# Patient Record
Sex: Male | Born: 1955
Health system: Southern US, Community
[De-identification: ages and names within clinical notes are randomized; demographics above are authoritative.]

## PROBLEM LIST (undated history)

## (undated) DIAGNOSIS — R0600 Dyspnea, unspecified: Secondary | ICD-10-CM

## (undated) DIAGNOSIS — J449 Chronic obstructive pulmonary disease, unspecified: Secondary | ICD-10-CM

## (undated) DIAGNOSIS — K219 Gastro-esophageal reflux disease without esophagitis: Secondary | ICD-10-CM

## (undated) DIAGNOSIS — Z87442 Personal history of urinary calculi: Secondary | ICD-10-CM

## (undated) DIAGNOSIS — J189 Pneumonia, unspecified organism: Secondary | ICD-10-CM

## (undated) DIAGNOSIS — I219 Acute myocardial infarction, unspecified: Secondary | ICD-10-CM

## (undated) DIAGNOSIS — I10 Essential (primary) hypertension: Secondary | ICD-10-CM

## (undated) DIAGNOSIS — I2510 Atherosclerotic heart disease of native coronary artery without angina pectoris: Secondary | ICD-10-CM

## (undated) HISTORY — DX: Atherosclerotic heart disease of native coronary artery without angina pectoris: I25.10

## (undated) HISTORY — PX: OTHER SURGICAL HISTORY: SHX169

## (undated) HISTORY — PX: ANKLE FRACTURE SURGERY: SHX122

## (undated) HISTORY — PX: CAROTID STENT: SHX1301

---

## 2003-04-03 ENCOUNTER — Emergency Department (HOSPITAL_COMMUNITY): Admission: EM | Admit: 2003-04-03 | Discharge: 2003-04-03 | Payer: Self-pay | Admitting: Emergency Medicine

## 2003-04-03 ENCOUNTER — Encounter: Payer: Self-pay | Admitting: Emergency Medicine

## 2003-04-22 ENCOUNTER — Encounter: Payer: Self-pay | Admitting: Family Medicine

## 2003-04-22 ENCOUNTER — Ambulatory Visit (HOSPITAL_COMMUNITY): Admission: RE | Admit: 2003-04-22 | Discharge: 2003-04-22 | Payer: Self-pay | Admitting: Family Medicine

## 2003-04-29 ENCOUNTER — Ambulatory Visit (HOSPITAL_COMMUNITY): Admission: RE | Admit: 2003-04-29 | Discharge: 2003-04-29 | Payer: Self-pay | Admitting: Family Medicine

## 2003-07-08 ENCOUNTER — Ambulatory Visit (HOSPITAL_COMMUNITY): Admission: RE | Admit: 2003-07-08 | Discharge: 2003-07-08 | Payer: Self-pay | Admitting: Family Medicine

## 2009-02-07 ENCOUNTER — Emergency Department (HOSPITAL_COMMUNITY): Admission: EM | Admit: 2009-02-07 | Discharge: 2009-02-07 | Payer: Self-pay | Admitting: Emergency Medicine

## 2009-12-03 ENCOUNTER — Emergency Department (HOSPITAL_COMMUNITY): Admission: EM | Admit: 2009-12-03 | Discharge: 2009-12-03 | Payer: Self-pay | Admitting: Emergency Medicine

## 2010-07-01 DIAGNOSIS — I2510 Atherosclerotic heart disease of native coronary artery without angina pectoris: Secondary | ICD-10-CM

## 2010-07-01 HISTORY — DX: Atherosclerotic heart disease of native coronary artery without angina pectoris: I25.10

## 2010-07-01 HISTORY — PX: CORONARY ANGIOPLASTY: SHX604

## 2010-07-12 ENCOUNTER — Emergency Department (HOSPITAL_COMMUNITY)
Admission: EM | Admit: 2010-07-12 | Discharge: 2010-07-12 | Disposition: A | Discharge status: Other Acute Inpatient Hospital | Payer: Self-pay | Source: Home / Self Care | Admitting: Emergency Medicine

## 2010-07-12 ENCOUNTER — Encounter: Payer: Self-pay | Admitting: Internal Medicine

## 2010-07-12 ENCOUNTER — Inpatient Hospital Stay (HOSPITAL_COMMUNITY)
Admission: EM | Admit: 2010-07-12 | Discharge: 2010-07-14 | Payer: Self-pay | Source: Home / Self Care | Attending: Internal Medicine | Admitting: Internal Medicine

## 2010-07-16 LAB — TROPONIN I: Troponin I: 1.31 ng/mL (ref 0.00–0.06)

## 2010-07-16 LAB — CBC
HCT: 39.9 % (ref 39.0–52.0)
HCT: 36 % — ABNORMAL LOW (ref 39.0–52.0)
HCT: 35.9 % — ABNORMAL LOW (ref 39.0–52.0)
Hemoglobin: 14.2 g/dL (ref 13.0–17.0)
Hemoglobin: 12.2 g/dL — ABNORMAL LOW (ref 13.0–17.0)
Hemoglobin: 12 g/dL — ABNORMAL LOW (ref 13.0–17.0)
MCH: 31.3 pg (ref 26.0–34.0)
MCH: 30.6 pg (ref 26.0–34.0)
MCH: 30.3 pg (ref 26.0–34.0)
MCHC: 35.6 g/dL (ref 30.0–36.0)
MCHC: 33.9 g/dL (ref 30.0–36.0)
MCHC: 33.4 g/dL (ref 30.0–36.0)
MCV: 88.1 fL (ref 78.0–100.0)
MCV: 90.2 fL (ref 78.0–100.0)
MCV: 90.7 fL (ref 78.0–100.0)
Platelets: 172 10*3/uL (ref 150–400)
Platelets: 150 10*3/uL (ref 150–400)
Platelets: 150 10*3/uL (ref 150–400)
RBC: 4.53 MIL/uL (ref 4.22–5.81)
RBC: 3.99 MIL/uL — ABNORMAL LOW (ref 4.22–5.81)
RBC: 3.96 MIL/uL — ABNORMAL LOW (ref 4.22–5.81)
RDW: 13.3 % (ref 11.5–15.5)
RDW: 13.5 % (ref 11.5–15.5)
RDW: 13.8 % (ref 11.5–15.5)
WBC: 12.6 10*3/uL — ABNORMAL HIGH (ref 4.0–10.5)
WBC: 10.2 10*3/uL (ref 4.0–10.5)
WBC: 8.4 10*3/uL (ref 4.0–10.5)

## 2010-07-16 LAB — LIPID PANEL
Cholesterol: 165 mg/dL (ref 0–200)
HDL: 36 mg/dL — ABNORMAL LOW (ref >39)
LDL Cholesterol: 116 mg/dL — ABNORMAL HIGH (ref 0–99)
Total CHOL/HDL Ratio: 4.6 RATIO
Triglycerides: 66 mg/dL (ref <150)
VLDL: 13 mg/dL (ref 0–40)

## 2010-07-16 LAB — DIFFERENTIAL
Basophils Absolute: 0 10*3/uL (ref 0.0–0.1)
Basophils Relative: 0 % (ref 0–1)
Eosinophils Absolute: 0 10*3/uL (ref 0.0–0.7)
Eosinophils Relative: 0 % (ref 0–5)
Lymphocytes Relative: 15 % (ref 12–46)
Lymphs Abs: 1.9 10*3/uL (ref 0.7–4.0)
Monocytes Absolute: 0.7 10*3/uL (ref 0.1–1.0)
Monocytes Relative: 6 % (ref 3–12)
Neutro Abs: 9.9 10*3/uL — ABNORMAL HIGH (ref 1.7–7.7)
Neutrophils Relative %: 79 % — ABNORMAL HIGH (ref 43–77)

## 2010-07-16 LAB — COMPREHENSIVE METABOLIC PANEL
ALT: 20 U/L (ref 0–53)
ALT: 22 U/L (ref 0–53)
AST: 25 U/L (ref 0–37)
AST: 72 U/L — ABNORMAL HIGH (ref 0–37)
Albumin: 4.3 g/dL (ref 3.5–5.2)
Albumin: 3.3 g/dL — ABNORMAL LOW (ref 3.5–5.2)
Alkaline Phosphatase: 61 U/L (ref 39–117)
Alkaline Phosphatase: 54 U/L (ref 39–117)
BUN: 16 mg/dL (ref 6–23)
BUN: 16 mg/dL (ref 6–23)
CO2: 26 mEq/L (ref 19–32)
CO2: 22 mEq/L (ref 19–32)
Calcium: 9.4 mg/dL (ref 8.4–10.5)
Calcium: 8.3 mg/dL — ABNORMAL LOW (ref 8.4–10.5)
Chloride: 101 mEq/L (ref 96–112)
Chloride: 106 mEq/L (ref 96–112)
Creatinine, Ser: 1.07 mg/dL (ref 0.4–1.5)
Creatinine, Ser: 0.88 mg/dL (ref 0.4–1.5)
GFR calc Af Amer: >60 mL/min (ref >60)
GFR calc Af Amer: >60 mL/min (ref >60)
GFR calc non Af Amer: >60 mL/min (ref >60)
GFR calc non Af Amer: >60 mL/min (ref >60)
Glucose, Bld: 115 mg/dL — ABNORMAL HIGH (ref 70–99)
Glucose, Bld: 112 mg/dL — ABNORMAL HIGH (ref 70–99)
Potassium: 4.1 mEq/L (ref 3.5–5.1)
Potassium: 4.6 mEq/L (ref 3.5–5.1)
Sodium: 135 mEq/L (ref 135–145)
Sodium: 137 mEq/L (ref 135–145)
Total Bilirubin: 0.3 mg/dL (ref 0.3–1.2)
Total Bilirubin: 0.6 mg/dL (ref 0.3–1.2)
Total Protein: 7.7 g/dL (ref 6.0–8.3)
Total Protein: 5.8 g/dL — ABNORMAL LOW (ref 6.0–8.3)

## 2010-07-16 LAB — POCT CARDIAC MARKERS
CKMB, poc: 11.9 ng/mL (ref 1.0–8.0)
Myoglobin, poc: >500 ng/mL (ref 12–200)
Troponin i, poc: 0.2 ng/mL — ABNORMAL HIGH (ref 0.00–0.09)

## 2010-07-16 LAB — BASIC METABOLIC PANEL
BUN: 14 mg/dL (ref 6–23)
CO2: 24 mEq/L (ref 19–32)
Calcium: 8.7 mg/dL (ref 8.4–10.5)
Chloride: 110 mEq/L (ref 96–112)
Creatinine, Ser: 0.92 mg/dL (ref 0.4–1.5)
GFR calc Af Amer: >60 mL/min (ref >60)
GFR calc non Af Amer: >60 mL/min (ref >60)
Glucose, Bld: 98 mg/dL (ref 70–99)
Potassium: 4.2 mEq/L (ref 3.5–5.1)
Sodium: 140 mEq/L (ref 135–145)

## 2010-07-16 LAB — APTT: aPTT: 28 seconds (ref 24–37)

## 2010-07-16 LAB — PROTIME-INR
INR: 0.93 (ref 0.00–1.49)
Prothrombin Time: 12.7 seconds (ref 11.6–15.2)

## 2010-07-16 LAB — CARDIAC PANEL(CRET KIN+CKTOT+MB+TROPI)
CK, MB: 151 ng/mL (ref 0.3–4.0)
CK, MB: 215.6 ng/mL (ref 0.3–4.0)
Relative Index: 16.2 — ABNORMAL HIGH (ref 0.0–2.5)
Relative Index: 14.1 — ABNORMAL HIGH (ref 0.0–2.5)
Total CK: 930 U/L — ABNORMAL HIGH (ref 7–232)
Total CK: 1526 U/L — ABNORMAL HIGH (ref 7–232)
Troponin I: 8.57 ng/mL (ref 0.00–0.06)
Troponin I: 19.81 ng/mL (ref 0.00–0.06)

## 2010-07-16 LAB — CK TOTAL AND CKMB (NOT AT ARMC)
CK, MB: 33.6 ng/mL (ref 0.3–4.0)
Relative Index: 11.1 — ABNORMAL HIGH (ref 0.0–2.5)
Total CK: 303 U/L — ABNORMAL HIGH (ref 7–232)

## 2010-07-16 LAB — HEPARIN LEVEL (UNFRACTIONATED)
Heparin Unfractionated: 0.11 IU/mL — ABNORMAL LOW (ref 0.30–0.70)
Heparin Unfractionated: <0.1 IU/mL — ABNORMAL LOW (ref 0.30–0.70)

## 2010-07-16 LAB — MAGNESIUM: Magnesium: 2.1 mg/dL (ref 1.5–2.5)

## 2010-07-16 LAB — MRSA PCR SCREENING: MRSA by PCR: NEGATIVE

## 2010-07-18 ENCOUNTER — Telehealth: Payer: Self-pay | Admitting: Internal Medicine

## 2010-07-22 NOTE — H&P (Addendum)
NAME:  Juan Martinez, Juan Martinez NO.:  [id number]  MEDICAL RECORD NO.:  [id number]          PATIENT TYPE:  INP  LOCATION:  2902                         FACILITY:  [facility]  PHYSICIAN:  Bevelyn Buckles. Nelsy Madonna, MDDATE OF BIRTH:  16-Nov-1955  DATE OF ADMISSION:  07/12/2010 DATE OF DISCHARGE:                             HISTORY & PHYSICAL   CHIEF COMPLAINT:  Chest pain.  HISTORY OF PRESENT ILLNESS:  The patient is a 55 year old white male with past medical history significant for tobacco use presenting with a several-hour history of burning in the chest.  The patient states that earlier this afternoon, he had acute onset of burning sensation in his chest that radiated to his left arm.  He denied any associated shortness of breath, nausea, vomiting, or diaphoresis.  The pain lasted approximately 1 hour before resolving then reoccurred.  At this point, he sought medical attention at the emergency room.  In emergency room, his troponin was found to be 1.5 with an unremarkable EKG.  He was given aspirin, Plavix, heparin, nitroglycerin, and metoprolol with resolution of the chest discomfort.  Here at [facility], the patient remains chest pain free.  He states that other than the chest discomfort today he has been in his normal state of health.  He has a primary care physician who he sees irregularly and denies any history of medical problems or current medication use.  PAST MEDICAL HISTORY:  None.  SOCIAL HISTORY:  Occasional alcohol, 2 packs a day of tobacco.  FAMILY HISTORY:  Positive for coronary artery disease in the mother; however, the age is unclear.  ALLERGIES:  No known drug allergies.  MEDICATIONS:  None.  REVIEW OF SYSTEMS:  As in HPI.  All other systems were reviewed and are negative.  PHYSICAL EXAMINATION:  VITAL SIGNS:  Blood pressure 197/55, heart rate 56, saturating 97% on 2 L. GENERAL:  No acute distress. HEENT:  Normocephalic, atraumatic. NECK:  Supple.   There is no JVD. HEART:  Regular rate and rhythm without murmur, rub, or gallop. LUNGS:  Clear to auscultation bilaterally. ABDOMEN:  Soft, nontender, nondistended. EXTREMITIES:  Without edema. SKIN:  Warm and dry. PSYCHIATRIC:  The patient is appropriate. NEURO:  Nonfocal. MUSCULOSKELETAL:  5/5 bilateral upper and lower extremity strength.  LABS FROM THE OUTSIDE HOSPITAL:  Sodium 135, potassium 4.1, chloride 101, CO2 of 26, BUN 16, creatinine 1.07, glucose 115.  White count 12.6, hemoglobin 14.2, hematocrit 39.9, platelet count 172.  Total CK 303, CK- MB 33.6, troponin was 1.3, INR 0.93.  EKG independently interpreted by myself demonstrates normal sinus rhythm with nonspecific ST-segment abnormalities in the inferior leads.  ASSESSMENT: 1. Acute coronary artery syndrome/non-ST-elevation myocardial     infarction. 2. Ongoing tobacco use.  PLAN:  The patient is admitted to the CCU under Dr. Gala Romney.  The aspirin, heparin, low-dose beta-blocker initiated at the outside hospital will be continued.  We will also place him on a statin.  He was loaded with 300 mg of Plavix at the outside hospital; however, I will hold off on this for now.  If he does go to intervention, we would recommend  additional 300 mg load or change to Effient.  He will be made n.p.o. after midnight for left heart catheterization in the a.m.  There appears to be no obvious contraindication to drug-eluting stent if PCI is warranted.     Brayton El, MD   ______________________________ Bevelyn Buckles. Trica Usery, MD    SGA/MEDQ  D:  07/13/2010  T:  07/13/2010  Job:  [id number]  Electronically Signed by Raynelle Bring MD on 07/13/2010 02:27:46 PM Electronically Signed by Arvilla Meres MD on 07/22/2010 07:13:16 PM

## 2010-07-23 ENCOUNTER — Ambulatory Visit: Admit: 2010-07-23 | Payer: Self-pay

## 2010-07-23 NOTE — Discharge Summary (Addendum)
NAME:  Juan Martinez, Juan Martinez                ACCOUNT NO.:  [id number]  MEDICAL RECORD NO.:  [id number]          PATIENT TYPE:  INP  LOCATION:  6531                         FACILITY:  [facility]  PHYSICIAN:  Colleen Can. Deborah Chalk, M.D.DATE OF BIRTH:  30-Sep-1955  DATE OF ADMISSION:  07/12/2010 DATE OF DISCHARGE:  07/14/2010                              DISCHARGE SUMMARY   DISCHARGE DIAGNOSES: 1. Non-ST segment elevation myocardial infarction with a peak troponin     of 19.81. 2. Newly diagnosed coronary artery disease by cath on July 13, 2010     with totally occluded right coronary artery, status post     percutaneous coronary intervention and Vision bare-metal stent     placement.     a.     Residual disease with 30% ostial left main, 60-70% stenosis      in the midportion of the circumflex, 30% ostial first diagonal,      30% proximal left anterior descending coronary artery. 3. Left ventricular ejection fraction of 45-50% with 2-3+ mitral     regurgitation by left ventriculogram by cath, July 13, 2010, for     followup echocardiogram as an outpatient. 4. Hyperlipidemia, total cholesterol 165, triglycerides 66, HDL 36,     LDL 116, initiated on statin therapy. 5. Tobacco abuse.  HOSPITAL COURSE:  Juan Martinez is a 55 year old gentleman with no prior cardiac history, who presented to [facility] with complaints of chest pain.  He does have a history of tobacco abuse.  His enzymes resulted being positive, and then he was transferred to [facility].  Peak troponin was 19.81.  He underwent cardiac catheterization on July 13, 2010 initially as a diagnostic by Dr. Shirlee Latch, which found the residual disease above, but also a totally occluded RCA and the proximal vessel after small acute marginals.  EF was 45-50% with 2-3+ mitral regurgitation.  The RCA was stented with a 3.5 x 18-mm Vision bare-metal stent.  The patient tolerated the procedure well.  He ambulated with cardiac  rehab and was thoroughly consulted regarding his tobacco abuse.  He was initiated on coronary artery disease regimen of medications including aspirin, beta-blocker, Plavix, and statin.  Dr. Deborah Chalk seen and examined him today and feels he is stable for discharge.  DISCHARGE LABS:  WBC 8.4, hemoglobin 12, hematocrit 35.9, platelet count 150.  Sodium 140, potassium 4.2, chloride 110, CO2 of 24, glucose 98, BUN 14, creatinine 0.92.  Total cholesterol 165, triglycerides 66, HDL 36, LDL 116.  STUDIES: 1. Cardiac catheterization on July 13, 2010, please see full report     for details as well as HPI for summary. 2. Chest x-ray on July 12, 2010 showed mild bronchitic changes and     borderline cardiomegaly.  DISCHARGE MEDICATIONS: 1. Nitroglycerin 0.4 mg every 5 minutes as needed, sublingual up to 3     doses for chest pain. 2. Aspirin 81 mg daily. 3. Toprol-XL 25 mg daily. 4. Plavix 75 mg daily. 5. Lipitor 20 mg at bedtime.  DISPOSITION:  Juan Martinez will be discharged in stable condition to home. He is not to return to work until  cleared by his cardiologist.  He is not to lift anything or participate in sexual activity for 1 week or drive for 2 days.  He is to follow a low-sodium, heart-healthy diet and if he notices any pain, swelling, bleeding, or pus at the cath site, he is to call or return.  He will follow up with Dr. Gala Romney in approximately 2 weeks and our office will call him with this appointment.  Our office will also call to arrange a 2-D echocardiogram to evaluate for his mitral regurgitation given findings on catheterization.  DURATION OF DISCHARGE ENCOUNTER:  Greater than 30 minutes including physician and PA time.     Dayna Dunn, P.A.C.   ______________________________ Colleen Can. Deborah Chalk, M.D.    DD/MEDQ  D:  07/14/2010  T:  07/15/2010  Job:  [id number]  cc:   Bevelyn Buckles. Bensimhon, MD  Electronically Signed by Ronie Spies  on 07/23/2010 10:32:23  AM Electronically Signed by Roger Shelter M.D. on 07/26/2010 03:35:00 PM

## 2010-07-31 NOTE — Procedures (Signed)
  NAME:  Juan Martinez, Juan Martinez                ACCOUNT NO.:  [id number]  MEDICAL RECORD NO.:  [id number]          PATIENT TYPE:  INP  LOCATION:  6531                         FACILITY:  [facility]  PHYSICIAN:  Veverly Fells. Excell Seltzer, MD  DATE OF BIRTH:  01-16-1956  DATE OF PROCEDURE:  07/13/2010 DATE OF DISCHARGE:                           CARDIAC CATHETERIZATION   PROCEDURE:  PTCA and stenting of the right coronary artery.  PROCEDURAL INDICATION:  [name] is a 55 year old gentleman who presented with non-ST-elevation infarction.  He had prolonged pain yesterday.  His had elevated enzymes.  CKs went up to 930, MBs to 150, and troponin went from 1 up to 8.  He underwent diagnostic catheterization by Dr. Shirlee Latch today that demonstrated total occlusion of the right coronary artery with faint collaterals from the left coronary.  We elected to proceed with PCI.  There was an indwelling 5-French sheath in the right radial artery.  The sheath was upsized to a 6-French over a 0.035 inch wire.  The JR-4 guide catheter was inserted and the patient was treated with bivalirudin for anticoagulation.  He has been adequately preloaded with Plavix.  A Cougar guidewire was advanced easily beyond the occlusion into the distal vessel.  The vessel was predilated with a 2.5 x 15-mm balloon.  The vessel was then stented with a 3.5 x 18-mm Vision bare metal stent.  The stent was deployed at 14 atmospheres and was well expanded.  There is an excellent angiographic result with 0% residual stenosis.  The stent was postdilated with a 3.75 x 12-mm White Bird Trek balloon which was taken to 16 atmospheres.  After postdilatation, the PDA was occluded and I suspect this was secondary to distal embolization.  The Cougar wire was advanced across the occlusion and a 2.0 x 15-mm Apex balloon was inflated to 6 and then 4 atmospheres more distally.  This reopened the vessel and reestablished TIMI 3 flow. The occlusion was moved to the most  distal portion of the PDA which was beyond an area that could be intervened upon.  Overall, there was an excellent angiographic result with 0% residual stenosis and TIMI 3 flow. The patient tolerated the procedure well.  There were no immediate complications.  FINAL CONCLUSIONS:  Successful PCI of the mid-right coronary artery using a single bare metal stent.  RECOMMENDATIONS:  The patient should remain on dual antiplatelet therapy with aspirin and Plavix for a minimum of 30 days and preferably for 1 year in the setting of his myocardial infarction.     Veverly Fells. Excell Seltzer, MD     MDC/MEDQ  D:  07/13/2010  T:  07/14/2010  Job:  [id number]  cc:   Kirk Ruths, M.D.  Electronically Signed by Tonny Bollman MD on 07/31/2010 04:49:59 AM

## 2010-08-02 NOTE — Progress Notes (Signed)
Summary: pt having shoulder pain and elevated b/p  Phone Note Call from Patient Call back at Home Phone [phone]   Caller: Spouse Reason for Call: Talk to Nurse, Talk to Doctor Summary of Call: pt b/p is 141/89 and he is haveing pain in right shoulder and he feels bad no other symptoms Initial call taken by: Omer Jack,  July 18, 2010 1:24 PM  Follow-up for Phone Call        Called patient's girlfriend back-not sure what to be concerned about since being d/c'd from hosp. Sat.  Having some right shoulder pain and BP 141/ 89.  Feeling OK otherwise.  On ASA and Pradaxa.  Advised try NTG sl for up to 3 and then if pain not relieved to go to ED.  Voiced understanding.  Follow-up by: Dessie Coma  LPN,  July 18, 2010 1:40 PM

## 2010-08-06 ENCOUNTER — Ambulatory Visit (HOSPITAL_COMMUNITY): Payer: BC Managed Care – PPO | Attending: Internal Medicine

## 2010-08-06 ENCOUNTER — Encounter: Payer: Self-pay | Admitting: Internal Medicine

## 2010-08-06 ENCOUNTER — Ambulatory Visit (INDEPENDENT_AMBULATORY_CARE_PROVIDER_SITE_OTHER): Payer: BC Managed Care – PPO | Admitting: Internal Medicine

## 2010-08-06 DIAGNOSIS — I219 Acute myocardial infarction, unspecified: Secondary | ICD-10-CM | POA: Insufficient documentation

## 2010-08-06 DIAGNOSIS — I059 Rheumatic mitral valve disease, unspecified: Secondary | ICD-10-CM

## 2010-08-06 DIAGNOSIS — I10 Essential (primary) hypertension: Secondary | ICD-10-CM | POA: Insufficient documentation

## 2010-08-06 DIAGNOSIS — I079 Rheumatic tricuspid valve disease, unspecified: Secondary | ICD-10-CM | POA: Insufficient documentation

## 2010-08-06 DIAGNOSIS — E785 Hyperlipidemia, unspecified: Secondary | ICD-10-CM | POA: Insufficient documentation

## 2010-08-06 DIAGNOSIS — I2510 Atherosclerotic heart disease of native coronary artery without angina pectoris: Secondary | ICD-10-CM | POA: Insufficient documentation

## 2010-08-09 NOTE — Procedures (Signed)
  NAME:  Juan Martinez, SALO NO.:  [id number]  MEDICAL RECORD NO.:  [id number]          PATIENT TYPE:  INP  LOCATION:  6531                         FACILITY:  [facility]  PHYSICIAN:  Marca Ancona, MD      DATE OF BIRTH:  [birth year]  DATE OF PROCEDURE:  07/13/2010 DATE OF DISCHARGE:                           CARDIAC CATHETERIZATION   PROCEDURE: 1. Left heart catheterization. 2. Coronary angiography. 3. Left ventriculography.  INDICATIONS:  This is a 55 year old who presented to [facility] with acute coronary syndrome.  He was set up for a left heart catheterization today.  PROCEDURE NOTE:  After informed consent was obtained, the patient underwent Allen testing on his right wrist.  The Allen test was positive signifying good collateral circulation from the ulnar artery to radial side of the hand.  The right wrist was sterilely prepped and draped. Lidocaine 1% was used to locally anesthetize the right radial area.  The right radial artery was accessed using modified Seldinger technique and a 5-French arterial sheath was placed.  The patient then received 3 mg of intra-arterial verapamil and 4000 units of IV heparin.  The right coronary artery was engaged using the JR-4 catheter.  The left coronary artery was engaged using JL-3.5 catheter and left ventricle was entered using angled pigtail catheter.  There were no complications.  FINDINGS: 1. Hemodynamics:  LV 96/20, aorta 101/64. 2. Left ventriculography:  EF was estimated at 45-50%.  The basal to     mid inferior wall was akinetic.  There was 2 to 3+ mitral     regurgitation. 3. Right coronary artery:  The right coronary artery was totally     occluded in the proximal vessel after 2 small acute marginals.  The     PDA reconstituted via faint left-to-right collaterals. 4. Left main:  The left main had about 30% ostial stenosis. 5. Left circumflex:  The circumflex gave off a moderate-sized obtuse     marginal and  following that, there was 60-70% stenosis in the     midportion of the vessel. 6. LAD system:  There was a high first diagonal about 30% ostial     stenosis.  There was 30% proximal stenosis in the LAD at about 30%     midvessel stenosis.  IMPRESSION:  This is a 55 year old who presented with acute coronary syndrome.  He is found to have an occluded right coronary artery and with a corresponding wall motion abnormality on left ventriculography.  We will plan percutaneous coronary intervention today.  Additionally, the patient has 2 to 3+ mitral regurgitation on his LV gram.  He should get an echocardiogram done at some point.     Marca Ancona, MD     DM/MEDQ  D:  07/13/2010  T:  07/14/2010  Job:  [id number]  Electronically Signed by Marca Ancona MD on 08/09/2010 08:31:25 AM

## 2010-08-16 NOTE — Letter (Signed)
Summary: [facility] Discharge Summary  [facility] Discharge Summary   Imported By: Earl Many 08/08/2010 16:47:10  _____________________________________________________________________  External Attachment:    Type:   Image     Comment:   External Document

## 2010-08-16 NOTE — Assessment & Plan Note (Signed)
Summary: Juan Martinez appt made from weekend mess-mb/hms   Visit Type:  Follow-up  CC:  no cardiac complaints.  History of Present Illness: Juan Martinez is a 55 year old male with a h/o HTN and heavy tobacco use. He was admitted in January 2012 with NSTEMI.  Peak troponin was 19.81.  He underwent cardiac catheterization on July 13, 2010 initially as a diagnostic by Dr. Shirlee Latch which showed totally occluded RCA and mild to moderate non-osbstructive CAD on the left.  EF was 45-50% with 2-3+ mitral regurgitation.  The RCA was stented with a 3.5 x 18-mm Vision bare-metal stent.   Returns to day for routine f/i. Back to work at [employer]. Denies CP. Occasional DOE. + GERD. Feels like medication is giving him headaches. has been compliant with Plavix. Continues to smoke 1/2 PPD (down from 3 PPD).  He had echo today in office which I reviewed personally. EF 50% with akinesis of basilar to mid inferior wal. Mild MR.   Current Medications (verified): 1)  Aspirin 81 Mg Tbec (Aspirin) .... Take One Daily 2)  Plavix 75 Mg Tabs (Clopidogrel Bisulfate) .... Take One Daily 3)  Nitrostat 0.4 Mg Subl (Nitroglycerin) .... Take As Needed 4)  Atorvastatin Calcium 20 Mg Tabs (Atorvastatin Calcium) .... Take One Daily 5)  Metoprolol Succinate 25 Mg Xr24h-Tab (Metoprolol Succinate) .... Take One Tablet By Mouth Daily  Allergies (verified): No Known Drug Allergies  Past History:  Past Medical History:  Non-ST segment elevation myocardial infarction-2012  Newly diagnosed coronary artery disease by cath on July 13, 2010       with totally occluded right coronary artery, status post       percutaneous coronary intervention and Vision bare-metal stent       placement.  Hyperlipidemia HTN COPD with ongoing obacco abuse  Family History: Reviewed history from 08/03/2010 and no changes required.  Positive for coronary artery disease in the mother;   however, the age is unclear.   Social History: Reviewed  history from 08/03/2010 and no changes required. Married. Works at [employer].  Occasional alcohol.  2-3 packs a day of tobacco  Review of Systems       As per HPI and past medical history; otherwise all systems negative.   Vital Signs:  Patient profile:   55 year old male Height:      69 inches Weight:      208 pounds BMI:     30.83 Pulse rate:   76 / minute Pulse rhythm:   regular BP sitting:   142 / 92  (right arm)  Vitals Entered By: Jacquelin Hawking, CMA (August 06, 2010 3:50 PM)  Physical Exam  General:  No acute distress. no resp difficulty HEENT: normal except for poor dentition Neck: supple. no JVD. Carotids 2+ bilat; no bruits. No lymphadenopathy or thryomegaly appreciated. Cor: PMI nondisplaced. Distant heart sounds.  Regular rate & rhythm. No rubs, gallops, murmur. Lungs: clearwith decreased air mvmt throughout Abdomen: obese soft, nontender, nondistended. No hepatosplenomegaly. No bruits or masses. Good bowel sounds. Extremities: no cyanosis, clubbing, rash, edema Neuro: alert & orientedx3, cranial nerves grossly intact. moves all 4 extremities w/o difficulty. affect pleasant    Impression & Recommendations:  Problem # 1:  CAD (ICD-414.00) Doing well post PCI. Not interested in cardiac rehab due to work schedule.  Will need aggressive RF management.   Problem # 2:  TOBACCO USE Counseled on need to quit smoking. Will scan abdominal aorta to rule out AAA.   Problem #  3:  HTN BP elevated. Will change lopressor to carvedilol 6.25 two times a day. Can titrate as needed.   Problem # 4:  MITRAL REGURGITATION Likele due to papillary muscle dysfunction due to inferior MI. This is mild. No further work-up for this currently.   Other Orders: EKG w/ Interpretation (93000)  Patient Instructions: 1)  Stop Metoprolol 2)  Start Carvedilol 6.25mg  two times a day  3)  Your physician has requested that you have an abdominal aorta duplex. During this test, an  ultrasound is used to evaluate the aorta. Allow 30 minutes for this exam. Do not eat after midnight the day before and avoid carbonated beverages. There are no restrictions or special instructions.  Needs in 3 months. 4)  Your physician wants you to follow-up in: 3 months.  You will receive a reminder letter in the mail two months in advance. If you don't receive a letter, please call our office to schedule the follow-up appointment. Prescriptions: CARVEDILOL 6.25 MG TABS (CARVEDILOL) Take one tablet by mouth twice a day  #30 x 6   Entered by:   Meredith Staggers, RN   Authorized by:   Dolores Patty, MD, [facility]   Signed by:   Meredith Staggers, RN on 08/06/2010   Method used:   Electronically to        [facility] [phone]* (retail)       [address]       [place], [place]  [address]       Ph: [phone]       Fax: [phone]   RxID:   [id number]

## 2010-11-15 ENCOUNTER — Other Ambulatory Visit: Payer: Self-pay | Admitting: *Deleted

## 2010-11-15 MED ORDER — CARVEDILOL 6.25 MG PO TABS: 6.2500 mg | ORAL_TABLET | Freq: Two times a day (BID) | ORAL | Status: DC

## 2010-11-22 ENCOUNTER — Telehealth: Payer: Self-pay | Admitting: Internal Medicine

## 2010-11-22 NOTE — Telephone Encounter (Signed)
6.25 mg carvedilol needs refill -uses [facility] [place] [phone]

## 2010-11-23 ENCOUNTER — Telehealth: Payer: Self-pay | Admitting: Internal Medicine

## 2010-11-23 MED ORDER — CARVEDILOL 6.25 MG PO TABS: 6.2500 mg | ORAL_TABLET | Freq: Two times a day (BID) | ORAL | Status: DC

## 2010-11-23 NOTE — Telephone Encounter (Signed)
Pt need Carvedilol to be call in to [facility] # [phone]

## 2010-11-23 NOTE — Telephone Encounter (Signed)
Pharmacy calling to get refill of carvedilol at [facility]

## 2010-11-23 NOTE — Telephone Encounter (Signed)
RX sent into pharmacy. Pt notified. 

## 2010-11-23 NOTE — Telephone Encounter (Signed)
[facility]vediolol [facility] [place]

## 2010-11-24 ENCOUNTER — Other Ambulatory Visit: Payer: Self-pay | Admitting: *Deleted

## 2010-11-24 MED ORDER — CARVEDILOL 6.25 MG PO TABS: 6.2500 mg | ORAL_TABLET | Freq: Two times a day (BID) | ORAL | Status: DC

## 2010-11-27 ENCOUNTER — Other Ambulatory Visit: Payer: Self-pay | Admitting: *Deleted

## 2010-11-27 DIAGNOSIS — I10 Essential (primary) hypertension: Secondary | ICD-10-CM

## 2010-11-27 MED ORDER — CARVEDILOL 6.25 MG PO TABS: 6.2500 mg | ORAL_TABLET | Freq: Two times a day (BID) | ORAL | Status: DC

## 2010-12-14 ENCOUNTER — Telehealth: Payer: Self-pay | Admitting: Internal Medicine

## 2010-12-14 NOTE — Telephone Encounter (Signed)
Steward Drone from dr dowdy office has question re pt meds and stent for dental procedure.

## 2010-12-14 NOTE — Telephone Encounter (Signed)
Needs tooth extraction wanted to know about holding Plavix advised we normally do not hold Plavix she will let MD know

## 2011-01-11 ENCOUNTER — Other Ambulatory Visit: Payer: Self-pay | Admitting: Internal Medicine

## 2011-03-02 DIAGNOSIS — I2510 Atherosclerotic heart disease of native coronary artery without angina pectoris: Secondary | ICD-10-CM

## 2011-03-02 HISTORY — DX: Atherosclerotic heart disease of native coronary artery without angina pectoris: I25.10

## 2011-03-21 ENCOUNTER — Inpatient Hospital Stay (HOSPITAL_COMMUNITY)
Admission: EM | Admit: 2011-03-21 | Discharge: 2011-03-24 | DRG: 122 | Disposition: A | Discharge status: Home / Self Care | Payer: BC Managed Care – PPO | Source: Other Acute Inpatient Hospital | Attending: Cardiology | Admitting: Cardiology

## 2011-03-21 ENCOUNTER — Other Ambulatory Visit: Payer: Self-pay

## 2011-03-21 ENCOUNTER — Encounter: Payer: Self-pay | Admitting: *Deleted

## 2011-03-21 ENCOUNTER — Emergency Department (HOSPITAL_COMMUNITY)
Admission: EM | Admit: 2011-03-21 | Discharge: 2011-03-21 | Disposition: A | Discharge status: Other Acute Inpatient Hospital | Payer: BC Managed Care – PPO | Source: Home / Self Care | Attending: Emergency Medicine | Admitting: Emergency Medicine

## 2011-03-21 DIAGNOSIS — I219 Acute myocardial infarction, unspecified: Secondary | ICD-10-CM | POA: Insufficient documentation

## 2011-03-21 DIAGNOSIS — I2510 Atherosclerotic heart disease of native coronary artery without angina pectoris: Secondary | ICD-10-CM | POA: Diagnosis present

## 2011-03-21 DIAGNOSIS — F17200 Nicotine dependence, unspecified, uncomplicated: Secondary | ICD-10-CM | POA: Diagnosis present

## 2011-03-21 DIAGNOSIS — R0789 Other chest pain: Secondary | ICD-10-CM | POA: Insufficient documentation

## 2011-03-21 DIAGNOSIS — J449 Chronic obstructive pulmonary disease, unspecified: Secondary | ICD-10-CM | POA: Diagnosis present

## 2011-03-21 DIAGNOSIS — I213 ST elevation (STEMI) myocardial infarction of unspecified site: Secondary | ICD-10-CM

## 2011-03-21 DIAGNOSIS — D696 Thrombocytopenia, unspecified: Secondary | ICD-10-CM | POA: Diagnosis present

## 2011-03-21 DIAGNOSIS — I2119 ST elevation (STEMI) myocardial infarction involving other coronary artery of inferior wall: Principal | ICD-10-CM | POA: Diagnosis present

## 2011-03-21 DIAGNOSIS — J4489 Other specified chronic obstructive pulmonary disease: Secondary | ICD-10-CM | POA: Diagnosis present

## 2011-03-21 DIAGNOSIS — E785 Hyperlipidemia, unspecified: Secondary | ICD-10-CM | POA: Diagnosis present

## 2011-03-21 DIAGNOSIS — Z7982 Long term (current) use of aspirin: Secondary | ICD-10-CM

## 2011-03-21 DIAGNOSIS — R079 Chest pain, unspecified: Secondary | ICD-10-CM

## 2011-03-21 DIAGNOSIS — Z7902 Long term (current) use of antithrombotics/antiplatelets: Secondary | ICD-10-CM

## 2011-03-21 DIAGNOSIS — I10 Essential (primary) hypertension: Secondary | ICD-10-CM | POA: Diagnosis present

## 2011-03-21 HISTORY — DX: Acute myocardial infarction, unspecified: I21.9

## 2011-03-21 HISTORY — PX: CARDIAC CATHETERIZATION: SHX172

## 2011-03-21 LAB — CBC
HCT: 43.7 % (ref 39.0–52.0)
HCT: 39.1 % (ref 39.0–52.0)
Hemoglobin: 15.1 g/dL (ref 13.0–17.0)
MCH: 31 pg (ref 26.0–34.0)
MCHC: 34.6 g/dL (ref 30.0–36.0)
MCHC: 35.5 g/dL (ref 30.0–36.0)
MCV: 89.7 fL (ref 78.0–100.0)
MCV: 87.1 fL (ref 78.0–100.0)
Platelets: 159 10*3/uL (ref 150–400)
Platelets: 133 10*3/uL — ABNORMAL LOW (ref 150–400)
RBC: 4.87 MIL/uL (ref 4.22–5.81)
RDW: 13.5 % (ref 11.5–15.5)
RDW: 13.3 % (ref 11.5–15.5)
WBC: 8.8 10*3/uL (ref 4.0–10.5)
WBC: 8.7 10*3/uL (ref 4.0–10.5)

## 2011-03-21 LAB — COMPREHENSIVE METABOLIC PANEL
Albumin: 3.8 g/dL (ref 3.5–5.2)
Alkaline Phosphatase: 66 U/L (ref 39–117)
BUN: 11 mg/dL (ref 6–23)
Chloride: 103 mEq/L (ref 96–112)
Creatinine, Ser: 0.75 mg/dL (ref 0.50–1.35)
GFR calc Af Amer: >60 mL/min (ref >60)
Glucose, Bld: 115 mg/dL — ABNORMAL HIGH (ref 70–99)
Potassium: 4.1 mEq/L (ref 3.5–5.1)
Total Bilirubin: 0.2 mg/dL — ABNORMAL LOW (ref 0.3–1.2)

## 2011-03-21 LAB — POCT I-STAT TROPONIN I
Troponin i, poc: 0.62 ng/mL (ref 0.00–0.08)
Troponin i, poc: 0.62 ng/mL (ref 0.00–0.08)

## 2011-03-21 LAB — DIFFERENTIAL
Basophils Absolute: 0 10*3/uL (ref 0.0–0.1)
Basophils Absolute: 0 10*3/uL (ref 0.0–0.1)
Basophils Relative: 0 % (ref 0–1)
Eosinophils Absolute: 0.2 10*3/uL (ref 0.0–0.7)
Eosinophils Absolute: 0.2 10*3/uL (ref 0.0–0.7)
Eosinophils Relative: 2 % (ref 0–5)
Eosinophils Relative: 2 % (ref 0–5)
Lymphocytes Relative: 11 % — ABNORMAL LOW (ref 12–46)
Lymphocytes Relative: 16 % (ref 12–46)
Lymphs Abs: 1 10*3/uL (ref 0.7–4.0)
Monocytes Absolute: 0.6 10*3/uL (ref 0.1–1.0)
Monocytes Absolute: 0.8 10*3/uL (ref 0.1–1.0)
Monocytes Relative: 7 % (ref 3–12)
Neutro Abs: 7 10*3/uL (ref 1.7–7.7)
Neutrophils Relative %: 80 % — ABNORMAL HIGH (ref 43–77)

## 2011-03-21 LAB — CK TOTAL AND CKMB (NOT AT ARMC)
CK, MB: 23.7 ng/mL (ref 0.3–4.0)
Relative Index: 7.9 — ABNORMAL HIGH (ref 0.0–2.5)
Total CK: 299 U/L — ABNORMAL HIGH (ref 7–232)

## 2011-03-21 LAB — BASIC METABOLIC PANEL
BUN: 16 mg/dL (ref 6–23)
CO2: 28 mEq/L (ref 19–32)
Calcium: 9.8 mg/dL (ref 8.4–10.5)
Chloride: 97 mEq/L (ref 96–112)
Creatinine, Ser: 0.76 mg/dL (ref 0.50–1.35)
GFR calc Af Amer: >60 mL/min (ref >60)
GFR calc non Af Amer: >60 mL/min (ref >60)
Glucose, Bld: 118 mg/dL — ABNORMAL HIGH (ref 70–99)
Potassium: 4.2 mEq/L (ref 3.5–5.1)
Sodium: 133 mEq/L — ABNORMAL LOW (ref 135–145)

## 2011-03-21 LAB — PROTIME-INR: INR: 0.99 (ref 0.00–1.49)

## 2011-03-21 LAB — APTT: aPTT: 31 seconds (ref 24–37)

## 2011-03-21 LAB — CARDIAC PANEL(CRET KIN+CKTOT+MB+TROPI)
CK, MB: 103.2 ng/mL (ref 0.3–4.0)
Total CK: 1204 U/L — ABNORMAL HIGH (ref 7–232)

## 2011-03-21 MED ORDER — ASPIRIN 81 MG PO CHEW: 324.0000 mg | CHEWABLE_TABLET | Freq: Once | ORAL | Status: DC

## 2011-03-21 MED ORDER — MORPHINE SULFATE 2 MG/ML IJ SOLN
INTRAMUSCULAR | Status: AC
  Filled 2011-03-21: qty 1

## 2011-03-21 MED ORDER — HEPARIN (PORCINE) IN NACL 100-0.45 UNIT/ML-% IJ SOLN: 1000.0000 [IU]/h | INTRAMUSCULAR | Status: DC

## 2011-03-21 MED ORDER — MORPHINE SULFATE 10 MG/ML IJ SOLN
6.0000 mg | Freq: Once | INTRAMUSCULAR | Status: AC
  Administered 2011-03-21: 6 mg via INTRAVENOUS

## 2011-03-21 MED ORDER — ASPIRIN 81 MG PO CHEW
CHEWABLE_TABLET | ORAL | Status: AC
  Administered 2011-03-21: 324 mg
  Filled 2011-03-21: qty 4

## 2011-03-21 MED ORDER — HEPARIN BOLUS VIA INFUSION: 4000.0000 [IU] | Freq: Once | INTRAVENOUS | Status: DC

## 2011-03-21 MED ORDER — HEPARIN (PORCINE) IN NACL 100-0.45 UNIT/ML-% IJ SOLN
1000.0000 [IU]/h | Freq: Once | INTRAMUSCULAR | Status: AC
  Administered 2011-03-21: 25000 [IU] via INTRAVENOUS

## 2011-03-21 MED ORDER — HEPARIN (PORCINE) IN NACL 100-0.45 UNIT/ML-% IJ SOLN
INTRAMUSCULAR | Status: AC
  Administered 2011-03-21: 25000 [IU] via INTRAVENOUS
  Filled 2011-03-21: qty 250

## 2011-03-21 MED ORDER — MORPHINE SULFATE 4 MG/ML IJ SOLN
INTRAMUSCULAR | Status: AC
  Administered 2011-03-21: 10:00:00
  Filled 2011-03-21: qty 1

## 2011-03-21 NOTE — Cardiovascular Report (Signed)
NAME:  DAILEY, BUCCHERI NO.:  [id number]  MEDICAL RECORD NO.:  [id number]  LOCATION:  [facility]                         FACILITY:  [facility]  PHYSICIAN:  Alegandra Sommers M. [place], M.D.  DATE OF BIRTH:  [birth year]  DATE OF PROCEDURE:  03/21/2011 DATE OF DISCHARGE:                           CARDIAC CATHETERIZATION   INDICATIONS FOR PROCEDURE:  The patient is a 56 year old white male with prior inferior non-ST-elevation myocardial infarction in January 2012 treated with bare metal stent to the right coronary artery .  He presents with acute onset of substernal chest pain at midnight last night 11 hours ago.  The patient took of total 12 nitroglycerin through the night and finally presented to the [facility] emergency room today with some continued chest pain.  EKG showed 1 mm of ST elevation inferiorly.  Cardiac enzymes were already positive.  He was brought emergently to the cardiac catheterization laboratory.  PROCEDURE:  Left heart catheterization, coronary and left ventricular angiography access via the right radial artery using standard Seldinger technique.  EQUIPMENT:  6-French 4 cm right Judkins catheter, 6-French 3.5-cm left Judkins catheter, 6-French pigtail catheter, 6-French arterial sheath.  MEDICATIONS:  Local anesthesia with 1% Xylocaine, verapamil 3 mg intra- arterial.  The patient was on a heparin drip and this was stopped at the beginning of his procedure.  CONTRAST:  80 mL of Omnipaque.  HEMODYNAMIC DATA:  Aortic pressure was 166/85 with a mean of 119 mmHg, left ventricular pressure is 166 with EDP of 27 mmHg.  ANGIOGRAPHIC DATA:  The right coronary artery is a dominant vessel.  The stent in the proximal vessel was widely patent.  There is a focal 40-50% stenosis in the midvessel at the takeoff of the first right ventricular marginal branch.  The left main coronary artery is without significant disease.  The left anterior descending artery demonstrates  mild calcification of the proximal vessel.  There are minimal wall irregularities in the LAD. The first diagonal has 40-50% disease proximally.  The left circumflex coronary artery gives rise to 2 marginal branches. It is occluded in the midvessel after the first marginal branch.  There is mild ostial narrowing of the left circumflex coronary artery.  There are no collaterals to the marginal branch.  Left ventricular angiography was performed in the RAO view.  This demonstrates inferior wall akinesis involving the mid to basal segment. Overall ejection fraction is 40-45%.  FINAL INTERPRETATION: 1. Single-vessel occlusive atherosclerotic coronary artery disease.     The mid circumflex is occluded which is a new finding compared to     January of this year. 2. Continued patency of the stent in the right coronary artery. 3. Moderate left ventricular dysfunction.  PLAN:  The patient is currently more than 11 hours out from the onset of his chest pain.  He already has positive enzymes.  He is currently pain free.  I think there is no additional benefit to reperfusion of the left circumflex coronary artery at this point.  We will treat the patient medically.          ______________________________ Yamari Ventola M. [place], M.D.     PMJ/MEDQ  D:  03/21/2011  T:  03/21/2011  Job:  [id number]  cc:   Bevelyn Buckles. Bensimhon, MD Kirk Ruths, M.D.  Electronically Signed by Renie Stelmach [place] M.D. on 03/21/2011 05:09:27 PM

## 2011-03-21 NOTE — H&P (Signed)
NAME:  Juan Martinez, Juan Martinez NO.:  [id number]  MEDICAL RECORD NO.:  [id number]  LOCATION:  [facility]                         FACILITY:  [facility]  PHYSICIAN:  Cecilia Nishikawa M. [place], M.D.  DATE OF BIRTH:  11-Feb-1956  DATE OF ADMISSION:  03/21/2011 DATE OF DISCHARGE:                             HISTORY & PHYSICAL   PRIMARY CARDIOLOGIST:  Previously Dr. Gala Romney (the patient lives in [place]).  PRIMARY CARE PHYSICIAN:  Kirk Ruths, MD  PATIENT PROFILE:  This is a 55 year old male with prior history of CAD status post non-ST-elevation MI and RCA stenting in January 2012 presents with acute inferolateral ST-segment elevation myocardial infarction.  PROBLEMS: 1. Acute inferolateral ST-elevation myocardial infarction. 2. Coronary artery disease.     a.     Status post non-ST-elevation myocardial infarction, July 13, 2010.     b.     Catheterization on July 14, 2010, ejection fraction 45-      50% with 2-3+ mitral regurgitation.  Left main 30%.  Left anterior      descending 30%, ostial, proximal and mid.  Left circumflex 60-70%      mid.  Right coronary artery 100% proximal.  The right coronary      artery was occluded and was successfully stented with a 3.5 x 18      mm Vision bare metal stent. 3. Hypertension. 4. Hyperlipidemia. 5. Ongoing tobacco abuse with a 90+ pack-year history, currently     smoking 1-1/2 packs a day. 6. Chronic obstructive pulmonary disease.  ALLERGIES:  No known drug allergies.  HISTORY OF PRESENT ILLNESS:  This is a 55 year old male with above problems.  The patient is status post non-ST-elevation mild RCA stenting in January 2012.  He reports compliance medications but has continued to smoke.  Early this morning about 12:30, the patient woke from sleep with sudden onset of 10/10 substernal chest discomfort similar to previous angina. No associated symptoms.  At the time, the patient was in [place]. He could not get back  to sleep and after about 6 hours of ongoing symptoms he started to drive back to the [place] area.  He drove straight to [facility] where ECG showed inferolateral ST- segment elevation and cardiac markers revealed a CK of 299 with an MB of 23.7.  A code STEMI was called and the patient was transferred to [facility] further evaluation.  Upon arrival, the patient complained of 4/10 chest pain and catheterization was ongoing.  HOME MEDICATIONS: 1. Aspirin 81 mg daily. 2. Plavix 75 mg daily. 3. Nitroglycerin p.r.n. 4. Lipitor 20 mg daily. 5. Coreg 6.25 mg b.i.d.  FAMILY HISTORY:  Mother is alive with history of coronary disease. Father died with history of Alzheimer's.  He has three brothers and four sisters.  There is no known history of coronary disease, diabetes, or stroke.  SOCIAL HISTORY:  The patient lives in [place] with his girlfriend. He works with heating and air conditioning.  He has a 90+ pack-year history of tobacco abuse, previously smoked 2-3 packs a day but now smoking 1-1/2 pack a day.  He denies alcohol or drugs.  REVIEW OF SYSTEMS:  Positive for chest pain as outlined in HPI.  He is a full code.  All systems were reviewed and negative.  PHYSICAL EXAMINATION:  VITAL SIGNS:  Temperature, he is afebrile, heart rate 60, respirations 20, blood pressure 165/105, pulse ox 100% on 2 L. GENERAL:  Pleasant white male in no acute distress, awake, alert, and oriented x3.  He has a normal affect. HEENT:  Normal. NEUROLOGIC:  Grossly nonfocal. SKIN:  Warm and dry without lesions or masses. NECK:  Supple without bruits or JVD. LUNGS:  Respirations are regular and unlabored.  Clear to auscultation. CARDIAC:  Regular S1-S2.  No S3, S4, or murmurs. ABDOMEN:  Soft, nontender, and nondistended.  Bowel sounds present x4. EXTREMITIES:  Warm, dry, and pink.  No clubbing, cyanosis, or edema. Dorsalis pedis and posterior tibialis 1+ and equal bilaterally.  LABORATORY  DATA:  EKG shows sinus rhythm, 1-mm ST-segment elevation in leads II, III, aVF, and V6.  He has inferior Qs.  Hemoglobin 15.1, hematocrit 43.7, WBC 8.8, and platelets 159.  Sodium 133, potassium 4.3, chloride 97, CO2 28, BUN 16, creatinine 0.76, glucose 118, and calcium 9.8.  CK 299 and MB 23.7.  ASSESSMENT AND PLAN: 1. Acute inferolateral ST-segment elevation myocardial infarction.     Plan for admission catheterization.  Continue aspirin, Plavix,     statin, and beta-blocker.  Check PTY12. 2. Tobacco abuse.  Cessation counseling. 3. Hyperlipidemia.  Check lipids and LFTs.  Add high-dose statin. 4. Hypertension.  Blood pressure is currently elevated here in the     lab.  We will continue beta blocker, add ACE inhibitor, and follow.     Nicolasa Ducking, ANP   ______________________________ Kaine Mcquillen M. [place], M.D.    CB/MEDQ  D:  03/21/2011  T:  03/21/2011  Job:  [id number]  Electronically Signed by Nicolasa Ducking ANP on 03/21/2011 04:01:55 PM Electronically Signed by Clorinda Wyble Sw[place] M.D. on 03/21/2011 56:21:30 PM

## 2011-03-21 NOTE — ED Notes (Signed)
Called [facility]  For Code Stemi.

## 2011-03-21 NOTE — ED Notes (Signed)
CRITICAL VALUE ALERT  Critical value received:  Troponin 0.62  Date of notification:  03/21/11  Time of notification:  1004  Critical value read back:yes  Nurse who received alert:  Tarri Glenn RN  MD notified (1st page):  Dr Magda Paganini  Time of first page:  1005  MD notified (2nd page):  Time of second page:  Responding MD:  Dr Magda Paganini  Time MD responded:  1005

## 2011-03-21 NOTE — ED Notes (Signed)
CRITICAL VALUE ALERT  Critical value received:  CKMB 23.7  Date of notification:  03/21/2011  Time of notification:  1021  Critical value read back:yes  Nurse who received alert:  EM  MD notified (1st page):  Pt transferred to [facility] at time of notification.  Notified Onalee Hua, RN at [facility] of critical value at 1022.  Time of first page:    MD notified (2nd page):  Time of second page:  Responding MD:    Time MD responded:

## 2011-03-21 NOTE — ED Provider Notes (Signed)
History   Chart scribed for Raeford Razor, MD by Enos Fling; the patient was seen in room APA15/APA15; this patient's care was started at 9:26 AM.    CSN: [id number] Arrival date & time: 03/21/2011  9:24 AM   Chief Complaint  Patient presents with  . Chest Pain     HPI - Level 5 caveat for urgent need for intervention Juan Martinez is a 55 y.o. male who presents to the Emergency Department complaining of chest pain. Pt states sharp, upper left chest pain has been constant since onset 9 hours ago, waking pt up from sleep. Pain radiates to left shoulder and arm with associated tingling and diaphoresis. Pt took approx 10 NTG last night with no relief of pain. Denies back pain, abd pain, sob, n/v, f/c, or leg swelling. Denies any other pain. Cardiac stent x 1 placed 9 months ago at time of "light heart attack". Pt takes daily 81mg  ASA but did not take it this AM. Pt currently taking plavix.  Cardiologist P[place]   Past Medical History  Diagnosis Date  . MI (myocardial infarction)      Past Surgical History  Procedure Date  . Carotid stent     History reviewed. No pertinent family history.  History  Substance Use Topics  . Smoking status: Current Everyday Smoker -- 2.0 packs/day for 20 years  . Smokeless tobacco: Not on file  . Alcohol Use: No      Review of Systems 10 Systems reviewed and are negative for acute change except as noted in the HPI.  Level 5 caveat for urgent need for intervention  Allergies  Review of patient's allergies indicates no known allergies.  Home Medications   Current Outpatient Rx  Name Route Sig Dispense Refill  . ATORVASTATIN CALCIUM 20 MG PO TABS  TAKE 1 TABLET BY MOUTH EVERY DAY 30 tablet 5  . CARVEDILOL 6.25 MG PO TABS Oral Take 1 tablet (6.25 mg total) by mouth 2 (two) times daily with a meal. 60 tablet 6    Physical Exam    There were no vitals taken for this visit.  Physical Exam  Nursing note and vitals  reviewed. Constitutional: He is oriented to person, place, and time. He appears well-developed and well-nourished.  HENT:  Head: Normocephalic.  Mouth/Throat: Mucous membranes are normal.  Eyes: Conjunctivae are normal.  Neck: Normal range of motion. Neck supple.  Cardiovascular: Normal rate, regular rhythm and intact distal pulses.  Exam reveals no gallop and no friction rub.   No murmur heard. Pulmonary/Chest: Effort normal and breath sounds normal. He has no wheezes. He has no rales. He exhibits no tenderness.  Abdominal: Soft. There is no tenderness.  Musculoskeletal: Normal range of motion. He exhibits no edema and no tenderness.  Neurological: He is alert and oriented to person, place, and time.  Skin: Skin is warm and dry. No rash noted.  Psychiatric: He has a normal mood and affect.    Procedures - none  OTHER DATA REVIEWED: Nursing notes and vital signs reviewed. Prior records reviewed.   DIAGNOSTIC STUDIES: Oxygen Saturation is 96% on room air, normal by my Interpretation.  EKG:   Date: 03/21/2011  Rate: 64  Rhythm: normal sinus rhythm  QRS Axis: normal  Intervals: normal  ST/T Wave abnormalities: ST elevations inferiorly  Conduction Disutrbances:none  Narrative Interpretation: ST segment elevation inferiorly, III greater than II and aVF. Equivocal ST depression V1.   Old EKG Reviewed: changes noted. Marked change from previous. q waves  noted inferior leads on previous with t-wave inversions.    Cardiac Monitor: 03/21/2011 9:26 AM Sinus, rate 71   LABS / RADIOLOGY:     ED COURSE: 9:35 AM - 324 mg ASA given   MDM: 55 yM with CP. Inferior STEMI. Hx of RCA occlusion which was stented in February. On plavix which reports compliance with. Plavix load deferred. ASA given. Concern for possible RV infarct so will defer from nitro at this time. BP stable. Low clinical suspicion for aortic dissection. Heparin ordered. Morphine for pain control. Discussed with cards.  Transfer to [facility] for cath lab.   IMPRESSION: 1. STEMI (ST elevation myocardial infarction)   2. Chest pain, unspecified      PLAN:  All results reviewed and discussed with pt, questions answered, pt agreeable with plan.   MEDS GIVEN IN ED: Medications - No data to display    SCRIBE ATTESTATION: I personally preformed the services scribed in my presence. The recorded information has been reviewed and considered. Raeford Razor, MD.         Raeford Razor, MD 03/21/11 1011

## 2011-03-21 NOTE — ED Notes (Signed)
Called EMS to transport 

## 2011-03-21 NOTE — ED Notes (Signed)
Pt c/o cp that started last pm pt describes pain as tingling that radiates down left arm. Pt states he took about 10 nitro tabs with no relief. Pt states he was sweaty and shaky last night.

## 2011-03-22 ENCOUNTER — Inpatient Hospital Stay (HOSPITAL_COMMUNITY): Payer: BC Managed Care – PPO

## 2011-03-22 DIAGNOSIS — I214 Non-ST elevation (NSTEMI) myocardial infarction: Secondary | ICD-10-CM

## 2011-03-22 LAB — LIPID PANEL
HDL: 38 mg/dL — ABNORMAL LOW (ref >39)
LDL Cholesterol: 118 mg/dL — ABNORMAL HIGH (ref 0–99)

## 2011-03-22 LAB — CARDIAC PANEL(CRET KIN+CKTOT+MB+TROPI)
CK, MB: 122.6 ng/mL (ref 0.3–4.0)
Relative Index: 7.5 — ABNORMAL HIGH (ref 0.0–2.5)
Total CK: 1634 U/L — ABNORMAL HIGH (ref 7–232)

## 2011-03-22 LAB — BASIC METABOLIC PANEL
BUN: 11 mg/dL (ref 6–23)
Calcium: 8.9 mg/dL (ref 8.4–10.5)
Creatinine, Ser: 0.78 mg/dL (ref 0.50–1.35)
GFR calc Af Amer: >60 mL/min (ref >60)
GFR calc non Af Amer: >60 mL/min (ref >60)
Glucose, Bld: 101 mg/dL — ABNORMAL HIGH (ref 70–99)
Potassium: 4.1 mEq/L (ref 3.5–5.1)

## 2011-03-22 LAB — CBC
HCT: 40.2 % (ref 39.0–52.0)
Hemoglobin: 13.9 g/dL (ref 13.0–17.0)
MCH: 30.3 pg (ref 26.0–34.0)
MCHC: 34.6 g/dL (ref 30.0–36.0)
MCV: 87.6 fL (ref 78.0–100.0)
RDW: 13.4 % (ref 11.5–15.5)

## 2011-03-23 LAB — BASIC METABOLIC PANEL
CO2: 25 mEq/L (ref 19–32)
Calcium: 9.1 mg/dL (ref 8.4–10.5)
Chloride: 100 mEq/L (ref 96–112)
Creatinine, Ser: 0.87 mg/dL (ref 0.50–1.35)
Glucose, Bld: 105 mg/dL — ABNORMAL HIGH (ref 70–99)

## 2011-03-23 LAB — CBC
MCH: 31.4 pg (ref 26.0–34.0)
MCHC: 36 g/dL (ref 30.0–36.0)
MCV: 87.3 fL (ref 78.0–100.0)
Platelets: 137 10*3/uL — ABNORMAL LOW (ref 150–400)
RDW: 13.5 % (ref 11.5–15.5)

## 2011-04-01 ENCOUNTER — Ambulatory Visit (INDEPENDENT_AMBULATORY_CARE_PROVIDER_SITE_OTHER): Payer: BC Managed Care – PPO | Admitting: Adult Health

## 2011-04-01 ENCOUNTER — Encounter: Payer: Self-pay | Admitting: Adult Health

## 2011-04-01 DIAGNOSIS — E7800 Pure hypercholesterolemia, unspecified: Secondary | ICD-10-CM | POA: Insufficient documentation

## 2011-04-01 DIAGNOSIS — F17200 Nicotine dependence, unspecified, uncomplicated: Secondary | ICD-10-CM

## 2011-04-01 DIAGNOSIS — Z720 Tobacco use: Secondary | ICD-10-CM

## 2011-04-01 DIAGNOSIS — I2510 Atherosclerotic heart disease of native coronary artery without angina pectoris: Secondary | ICD-10-CM

## 2011-04-01 DIAGNOSIS — F17210 Nicotine dependence, cigarettes, uncomplicated: Secondary | ICD-10-CM | POA: Insufficient documentation

## 2011-04-01 NOTE — Patient Instructions (Signed)
Your physician recommends that you continue on your current medications as directed. Please refer to the Current Medication list given to you today.  Your physician recommends that you schedule a follow-up appointment in: 3 months  

## 2011-04-01 NOTE — Assessment & Plan Note (Signed)
Most recent labs while in hospital: TC: 175; TG: 96, HDL: 38, and LDL 118. He is advised on low cholesterol diet. Will drawn labs again on next visit.

## 2011-04-01 NOTE — Assessment & Plan Note (Signed)
He is again, counseled on smoking cessation. He has no plans to quit, but is willing to cut down. He has gone from 2 1/2 ppd, to 1/2 a day.  He is encouraged to stop completely and is aware of the risks involved in continuing to smoke with CAD.

## 2011-04-01 NOTE — Assessment & Plan Note (Signed)
He is asymptomatic with recurrence of angina or need to take NTG. He is refusing cardiac rehab.  I have given him a return to work letter starting Oct 8,2012.  He is advised to keep NTG with him at all times, refill this every 3 months whether used or not. He verbalizes understanding.

## 2011-04-01 NOTE — Progress Notes (Signed)
HPI: Juan Martinez is a 55 y/o patient of Dr. Gala Romney, who wishes to be followed in [place].  He has a history of CAD with BMS to the RCA in January of 2012 in the setting of NSTEMI, continued tobacco abuse and hypercholesterolemia.  He comes today post hospitalization for another NSTEMI of the CX artery. He had cardiac catheterization demonstrating total occlusion of the mid circumflex, but continued patency of the RCA stent. He was placed on Effient and Crestor, lipitor was discontinued. He was counseled on smoking cessation.  He comes today without complaint of recurrent chest discomfort and left arm pain, which is what he experienced during each ischemic event.  He has not gone to cardiac rehab " what for?"  He continues to smoke but is cutting down. He would like to go back to work.   No Known Allergies  Current Outpatient Prescriptions  Medication Sig Dispense Refill  . acetaminophen (TYLENOL) 500 MG tablet Take 1,000 mg by mouth every 6 (six) hours as needed. For headache       . aspirin 81 MG tablet Take 81 mg by mouth daily.        . carvedilol (COREG) 6.25 MG tablet Take 1 tablet (6.25 mg total) by mouth 2 (two) times daily with a meal.  60 tablet  6  . nitroGLYCERIN (NITROSTAT) 0.4 MG SL tablet Place 0.4 mg under the tongue every 5 (five) minutes as needed.        . prasugrel (EFFIENT) 10 MG TABS Take by mouth daily.        . rosuvastatin (CRESTOR) 40 MG tablet Take 40 mg by mouth daily.          Past Medical History  Diagnosis Date  . MI (myocardial infarction)   . Coronary artery disease 07/2010    NSTEMI of RCA inferior  . CAD (coronary artery disease), native coronary artery 03/2011    NSTEMI of Cx     Past Surgical History  Procedure Date  . Carotid stent   . Cardiac catheterization 03/21/2011    Single vessel occlusive atheroscleotic CAD.  The mid CX is occluded which is a new finding compared to January of 2012. Continued patency fo the stent in the RCA. Moderate LV dysfx  with EF of 40-45%.  . Coronary angioplasty 07/2010    BMS to RCA     WUJ:WJXBJY of systems complete and found to be negative unless listed above PHYSICAL EXAM BP 115/66  Pulse 65  Resp 18  Ht 5\' 9"  (1.753 m)  Wt 189 lb 1.9 oz (85.784 kg)  BMI 27.93 kg/m2  SpO2 89%  General: Well developed, well nourished, in no acute distress Head: Eyes PERRLA, No xanthomas.   Normal cephalic and atramatic Poor dentition.  Lungs: Clear bilaterally to auscultation and percussion. Heart: HRRR S1 S21/6 systolic murmur. .  Pulses are 2+ & equal.            No carotid bruit. No JVD.  No abdominal bruits. No femoral bruits. Abdomen: Bowel sounds are positive, abdomen soft and non-tender without masses or  Hernia's noted. Msk:  Back normal, normal gait. Normal strength and tone for age. Extremities: No clubbing, cyanosis or edema.  DP +1. Right radial catheter insertion site it well healed without bleeding or hematoma.  Neuro: Alert and oriented X 3. Psych:  Good affect, responds appropriately NSR rate of 62 bpm. Inferior t-wave inversion with Q-waves.:  ASSESSMENT AND PLAN

## 2011-04-04 ENCOUNTER — Encounter: Payer: Self-pay | Admitting: Adult Health

## 2011-04-26 NOTE — Discharge Summary (Signed)
NAME:  Juan Martinez, Juan Martinez NO.:  [id number]  MEDICAL RECORD NO.:  [id number]  LOCATION:  [id number]                         FACILITY:  [facility]  PHYSICIAN:  Duke Salvia, MD, FACCDATE OF BIRTH:  [birth year]  DATE OF ADMISSION:  03/21/2011 DATE OF DISCHARGE:  03/24/2011                              DISCHARGE SUMMARY   PROCEDURES: 1. Cardiac catheterization. 2. Coronary arteriogram. 3. Left ventriculogram. 4. Portable chest x-ray.  PRIMARY FINAL DISCHARGE DIAGNOSIS:  ST-elevation myocardial infarction, late presentation.  SECONDARY DIAGNOSES: 1. Ongoing tobacco use, greater than 90-pack years. 2. Non-ST-segment elevation myocardial infarction in January 2012 with     bare-metal stent to the right coronary artery. 3. Hypertension. 4. Hyperlipidemia with total cholesterol 175, triglycerides 96, HDL     38, and LDL 118 this admission. 5. Chronic obstructive pulmonary disease. 6. Family history of coronary artery disease in his mother. 7. Inadequate platelet inhibition on Plavix with P2Y12 test greater     than 238 at 263.  TIME AT DISCHARGE:  34 minutes.  HOSPITAL COURSE:  Juan Martinez is a 55 year old male with a history of coronary artery disease.  He developed chest pain in [place] and when his symptoms did not resolve despite multiple nitroglycerin he drove back to [place].  At [facility], his cardiac enzymes were elevated and he was transferred urgently to [facility] and taken to the Cath Lab.  The cardiac catheterization showed the dominant RCA had a patent stent and a 50% lesion in the mid vessel.  The left main was patent and the LAD had luminal irregularities.  D1 was 50% stenosed and the circumflex was occluded in the midvessel.  There were no collaterals.  His EF was 40-45%.  Dr. Swaziland evaluated the films and felt that he was more than 11 hours out from the onset of chest pain with positive enzymes, but no ongoing chest pain.  He did not feel  that there was any additional benefit to reperfusion of the left circumflex and the patient should be treated medically.  He had mild thrombocytopenia with a platelet count of 159 on admission and 137 at discharge.  He had mild hyperglycemia with a fasting glucose of 105.  His hemoglobin A1c was 6.0.  His peak CK-MB was 1634/122.6 with a peak troponin I of 24.98.  TSH was within normal limits and lipid profile as described above.  Chest x-ray showed calcified granuloma overlying the right midlung and no acute disease.  The granuloma was seen in January 2012.  Juan Martinez was seen by Smoking Cessation, but was not interested in quitting.  He was seen by Cardiac Rehab.  He was educated regarding heart-healthy lifestyle changes.  He had been off Lipitor because of leg pain and was put on Crestor.  It was also felt that he would benefit from a dental consult as an outpatient.  By March 24, 2011, Juan Martinez was ambulating without chest pain or shortness of breath.  He was evaluated by Dr. Graciela Husbands and considered stable for discharge, to follow up as an outpatient.  Of note, he was on a beta-blocker and this was continued, but no ACE inhibitor was added secondary to  problems with hypotension.  DISCHARGE INSTRUCTIONS: 1. His activity level is to be increased gradually. 2. He is encouraged to stick to a low-sodium, heart-healthy diet. 3. He is encouraged not to use tobacco. 4. He is to call our office for problems with the cath site. 5. He is to follow up with the [facility] office and we will call them     for an appointment. 6. He is to follow up with Dr. Regino Schultze as needed or as scheduled. 7. He is encouraged to obtain a dentist.  DISCHARGE MEDICATIONS: 1. Coreg 6.25 mg b.i.d. 2. Lipitor is discontinued. 3. Crestor 40 mg daily. 4. Sublingual nitroglycerin p.r.n. 5. Aspirin 81 mg a day. 6. Effient 10 mg a day.     Theodore Demark, PA-C   ______________________________ Duke Salvia, MD, [facility]    RB/MEDQ  D:  03/24/2011  T:  03/24/2011  Job:  [id number]  cc:   Kirk Ruths, M.D.  Electronically Signed by Theodore Demark PA-C on 04/08/2011 06:47:38 AM Electronically Signed by Sherryl Manges MD FACC on 04/26/2011 07:23:03 AM

## 2011-07-03 ENCOUNTER — Other Ambulatory Visit: Payer: Self-pay | Admitting: Internal Medicine

## 2011-07-03 DIAGNOSIS — I10 Essential (primary) hypertension: Secondary | ICD-10-CM

## 2011-07-03 MED ORDER — CARVEDILOL 6.25 MG PO TABS: 6.2500 mg | ORAL_TABLET | Freq: Two times a day (BID) | ORAL | Status: DC

## 2011-07-19 ENCOUNTER — Encounter: Payer: Self-pay | Admitting: Adult Health

## 2011-07-19 ENCOUNTER — Ambulatory Visit (INDEPENDENT_AMBULATORY_CARE_PROVIDER_SITE_OTHER): Payer: BC Managed Care – PPO | Admitting: Adult Health

## 2011-07-19 VITALS — BP 149/86 | HR 74 | Ht 69.0 in | Wt 198.0 lb

## 2011-07-19 DIAGNOSIS — Z720 Tobacco use: Secondary | ICD-10-CM

## 2011-07-19 DIAGNOSIS — F17200 Nicotine dependence, unspecified, uncomplicated: Secondary | ICD-10-CM

## 2011-07-19 DIAGNOSIS — I10 Essential (primary) hypertension: Secondary | ICD-10-CM

## 2011-07-19 DIAGNOSIS — E7800 Pure hypercholesterolemia, unspecified: Secondary | ICD-10-CM

## 2011-07-19 DIAGNOSIS — I2510 Atherosclerotic heart disease of native coronary artery without angina pectoris: Secondary | ICD-10-CM

## 2011-07-19 MED ORDER — CARVEDILOL 6.25 MG PO TABS: 6.2500 mg | ORAL_TABLET | Freq: Two times a day (BID) | ORAL | Status: DC

## 2011-07-19 NOTE — Progress Notes (Signed)
   HPI:Juan Martinez is a 56 y/o patient of Dr. Dietrich Pates we are seeing for ongoing assessment and treatment of CAD, s/p NSTEMI with BMS to the RCA in January of 2012. He had a repeat cardiac cath in the setting of NSTEMI of the cx with total occlusion of the mid Cx with continued patency of the RCA stent. He continues on Effient without recurrent chest pain. He unfortunately continues to smoke.   No Known Allergies  Current Outpatient Prescriptions  Medication Sig Dispense Refill  . acetaminophen (TYLENOL) 500 MG tablet Take 1,000 mg by mouth every 6 (six) hours as needed. For headache       . aspirin 81 MG tablet Take 81 mg by mouth daily.        . carvedilol (COREG) 6.25 MG tablet Take 1 tablet (6.25 mg total) by mouth 2 (two) times daily with a meal.  180 tablet  3  . nitroGLYCERIN (NITROSTAT) 0.4 MG SL tablet Place 0.4 mg under the tongue every 5 (five) minutes as needed.        . prasugrel (EFFIENT) 10 MG TABS Take by mouth daily.        . rosuvastatin (CRESTOR) 40 MG tablet Take 40 mg by mouth daily.        Marland Kitchen DISCONTD: carvedilol (COREG) 6.25 MG tablet Take 1 tablet (6.25 mg total) by mouth 2 (two) times daily with a meal.  60 tablet  1    Past Medical History  Diagnosis Date  . MI (myocardial infarction)   . Coronary artery disease 07/2010    NSTEMI of RCA inferior  . CAD (coronary artery disease), native coronary artery 03/2011    NSTEMI of Cx     Past Surgical History  Procedure Date  . Carotid stent   . Cardiac catheterization 03/21/2011    Single vessel occlusive atheroscleotic CAD.  The mid CX is occluded which is a new finding compared to January of 2012. Continued patency fo the stent in the RCA. Moderate LV dysfx with EF of 40-45%.  . Coronary angioplasty 07/2010    BMS to RCA     RUE:AVWUJW of systems complete and found to be negative unless listed above PHYSICAL EXAM BP 149/86  Pulse 74  Ht 5\' 9"  (1.753 m)  Wt 198 lb (89.812 kg)  BMI 29.24 kg/m2  General: Well  developed, well nourished, in no acute distress Head: Eyes PERRLA, No xanthomas.   Normal cephalic and atramatic  Lungs: Clear bilaterally to auscultation and percussion. Heart: HRRR S1 S2, without MRG.  Pulses are 2+ & equal.            No carotid bruit. No JVD.  No abdominal bruits. No femoral bruits. Abdomen: Bowel sounds are positive, abdomen soft and non-tender without masses or                  Hernia's noted. Msk:  Back normal, normal gait. Normal strength and tone for age. Extremities: No clubbing, cyanosis or edema.  DP +1 Neuro: Alert and oriented X 3. Psych:  Good affect, responds appropriately   ASSESSMENT AND PLAN

## 2011-07-19 NOTE — Assessment & Plan Note (Signed)
Lipid status is ordered for ongoing assessment.

## 2011-07-19 NOTE — Assessment & Plan Note (Signed)
I have counseled him on cessation. He has cut down to 1/2 pack a day from 2 ppd. He is trying to quit.

## 2011-07-19 NOTE — Patient Instructions (Signed)
Your physician recommends that you schedule a follow-up appointment in: 6 months  Your physician recommends that you return for lab work in: Next week   

## 2011-07-19 NOTE — Assessment & Plan Note (Signed)
He is without complaint at this time. I have given him refills of Coreg. He will have labs drawn for kidney fx and lipids status.

## 2012-12-25 ENCOUNTER — Encounter (HOSPITAL_COMMUNITY): Payer: Self-pay | Admitting: *Deleted

## 2012-12-25 ENCOUNTER — Emergency Department (HOSPITAL_COMMUNITY)
Admission: EM | Admit: 2012-12-25 | Discharge: 2012-12-25 | Disposition: A | Discharge status: Home / Self Care | Payer: BC Managed Care – PPO | Attending: Emergency Medicine | Admitting: Emergency Medicine

## 2012-12-25 DIAGNOSIS — I10 Essential (primary) hypertension: Secondary | ICD-10-CM | POA: Insufficient documentation

## 2012-12-25 DIAGNOSIS — L03116 Cellulitis of left lower limb: Secondary | ICD-10-CM

## 2012-12-25 DIAGNOSIS — Z79899 Other long term (current) drug therapy: Secondary | ICD-10-CM | POA: Insufficient documentation

## 2012-12-25 DIAGNOSIS — F17200 Nicotine dependence, unspecified, uncomplicated: Secondary | ICD-10-CM | POA: Insufficient documentation

## 2012-12-25 DIAGNOSIS — L03119 Cellulitis of unspecified part of limb: Secondary | ICD-10-CM | POA: Insufficient documentation

## 2012-12-25 DIAGNOSIS — I252 Old myocardial infarction: Secondary | ICD-10-CM | POA: Insufficient documentation

## 2012-12-25 DIAGNOSIS — Z9861 Coronary angioplasty status: Secondary | ICD-10-CM | POA: Insufficient documentation

## 2012-12-25 DIAGNOSIS — Z7982 Long term (current) use of aspirin: Secondary | ICD-10-CM | POA: Insufficient documentation

## 2012-12-25 DIAGNOSIS — I2510 Atherosclerotic heart disease of native coronary artery without angina pectoris: Secondary | ICD-10-CM | POA: Insufficient documentation

## 2012-12-25 DIAGNOSIS — L02419 Cutaneous abscess of limb, unspecified: Secondary | ICD-10-CM | POA: Insufficient documentation

## 2012-12-25 HISTORY — DX: Essential (primary) hypertension: I10

## 2012-12-25 MED ORDER — SULFAMETHOXAZOLE-TMP DS 800-160 MG PO TABS
1.0000 | ORAL_TABLET | Freq: Once | ORAL | Status: AC
  Administered 2012-12-25: 1 via ORAL
  Filled 2012-12-25: qty 1

## 2012-12-25 MED ORDER — TRAMADOL HCL 50 MG PO TABS: 50.0000 mg | ORAL_TABLET | Freq: Four times a day (QID) | ORAL | Status: DC | PRN

## 2012-12-25 MED ORDER — SULFAMETHOXAZOLE-TRIMETHOPRIM 800-160 MG PO TABS: 1.0000 | ORAL_TABLET | Freq: Two times a day (BID) | ORAL | Status: DC

## 2012-12-25 MED ORDER — NAPROXEN 500 MG PO TABS: 500.0000 mg | ORAL_TABLET | Freq: Two times a day (BID) | ORAL | Status: DC

## 2012-12-25 MED ORDER — CEPHALEXIN 500 MG PO CAPS: 500.0000 mg | ORAL_CAPSULE | Freq: Four times a day (QID) | ORAL | Status: DC

## 2012-12-25 NOTE — ED Provider Notes (Signed)
History    CSN: [id number] Arrival date & time 12/25/12  1711  First MD Initiated Contact with Patient 12/25/12 1740     Chief Complaint  Patient presents with  . Leg Pain   (Consider location/radiation/quality/duration/timing/severity/associated sxs/prior Treatment) HPI Comments: 57 y/o male with no DM, no frequent infections - presents with redness and warmth to the LLE - started 2 days ago - had a fll 4 days ago hurting the anterior LLE just distal to the patella on a metal stair.  This was gradual in onset, constant, gradually worsening and not associated with fevers.  Patient is a 57 y.o. male presenting with leg pain. The history is provided by the patient.  Leg Pain Associated symptoms: no fever    Past Medical History  Diagnosis Date  . MI (myocardial infarction)   . Coronary artery disease 07/2010    NSTEMI of RCA inferior  . CAD (coronary artery disease), native coronary artery 03/2011    NSTEMI of Cx   . Hypertension    Past Surgical History  Procedure Laterality Date  . Carotid stent    . Cardiac catheterization  03/21/2011    Single vessel occlusive atheroscleotic CAD.  The mid CX is occluded which is a new finding compared to January of 2012. Continued patency fo the stent in the RCA. Moderate LV dysfx with EF of 40-45%.  . Coronary angioplasty  07/2010    BMS to RCA    Family History  Problem Relation Age of Onset  . Heart disease Mother    History  Substance Use Topics  . Smoking status: Current Every Day Smoker -- 0.50 packs/day for 20 years  . Smokeless tobacco: Not on file  . Alcohol Use: No    Review of Systems  Constitutional: Negative for fever.  Skin: Positive for rash.    Allergies  Review of patient's allergies indicates no known allergies.  Home Medications   Current Outpatient Rx  Name  Route  Sig  Dispense  Refill  . acetaminophen (TYLENOL) 500 MG tablet   Oral   Take 1,000 mg by mouth every 6 (six) hours as needed. For headache          . aspirin 81 MG tablet   Oral   Take 81 mg by mouth daily.           . carvedilol (COREG) 6.25 MG tablet   Oral   Take 1 tablet (6.25 mg total) by mouth 2 (two) times daily with a meal.   180 tablet   3   . cephALEXin (KEFLEX) 500 MG capsule   Oral   Take 1 capsule (500 mg total) by mouth 4 (four) times daily.   40 capsule   0   . naproxen (NAPROSYN) 500 MG tablet   Oral   Take 1 tablet (500 mg total) by mouth 2 (two) times daily with a meal.   30 tablet   0   . nitroGLYCERIN (NITROSTAT) 0.4 MG SL tablet   Sublingual   Place 0.4 mg under the tongue every 5 (five) minutes as needed.           . prasugrel (EFFIENT) 10 MG TABS   Oral   Take by mouth daily.           . rosuvastatin (CRESTOR) 40 MG tablet   Oral   Take 40 mg by mouth daily.           Marland Kitchen sulfamethoxazole-trimethoprim (SEPTRA DS) 800-160 MG per  tablet   Oral   Take 1 tablet by mouth every 12 (twelve) hours.   20 tablet   0   . traMADol (ULTRAM) 50 MG tablet   Oral   Take 1 tablet (50 mg total) by mouth every 6 (six) hours as needed for pain.   15 tablet   0    BP 140/76  Pulse 78  Temp(Src) 97.8 F (36.6 C) (Oral)  Resp 20  Ht 5\' 9"  (1.753 m)  Wt 196 lb (88.905 kg)  BMI 28.93 kg/m2  SpO2 100% Physical Exam  Nursing note and vitals reviewed. Constitutional: He appears well-developed and well-nourished. No distress.  HENT:  Head: Normocephalic and atraumatic.  Eyes: Conjunctivae are normal. Right eye exhibits no discharge. Left eye exhibits no discharge. No scleral icterus.  Cardiovascular: Normal rate and regular rhythm.   No murmur heard. Pulmonary/Chest: Effort normal and breath sounds normal.  Musculoskeletal: He exhibits tenderness ( below the knee and above the ankle on the L, not circumferntial, no induration and no fluctuance. warm to touch). He exhibits no edema.       Legs: Skin: Skin is warm and dry. Rash noted. He is not diaphoretic. There is erythema.    ED  Course  Procedures (including critical care time) Labs Reviewed - No data to display No results found. 1. Cellulitis of left lower extremity     MDM  The pt appears non toxic, he has no abscess clinically and is tolerating PO meds.  Stable for d/c. Outlines applied prior to d/c. Will f/u on Monday with PMD, here if worsened, expressed understanding.  Meds given in ED:  Medications  sulfamethoxazole-trimethoprim (BACTRIM DS) 800-160 MG per tablet 1 tablet (not administered)    New Prescriptions   CEPHALEXIN (KEFLEX) 500 MG CAPSULE    Take 1 capsule (500 mg total) by mouth 4 (four) times daily.   NAPROXEN (NAPROSYN) 500 MG TABLET    Take 1 tablet (500 mg total) by mouth 2 (two) times daily with a meal.   SULFAMETHOXAZOLE-TRIMETHOPRIM (SEPTRA DS) 800-160 MG PER TABLET    Take 1 tablet by mouth every 12 (twelve) hours.   TRAMADOL (ULTRAM) 50 MG TABLET    Take 1 tablet (50 mg total) by mouth every 6 (six) hours as needed for pain.      Vida Roller, MD 12/25/12 [id number]

## 2012-12-25 NOTE — ED Notes (Signed)
Pain, redness lt lower leg and knee, hot to touch.  Tripped and fell on Tuesday and had some pain to lt knee,

## 2013-03-13 ENCOUNTER — Other Ambulatory Visit: Payer: Self-pay | Admitting: Internal Medicine

## 2013-03-29 ENCOUNTER — Other Ambulatory Visit: Payer: Self-pay | Admitting: *Deleted

## 2013-08-21 ENCOUNTER — Other Ambulatory Visit: Payer: Self-pay | Admitting: Cardiology

## 2013-08-23 NOTE — Telephone Encounter (Signed)
NITROSTAT 0.4 MG SL tablet 25 tablet 0 03/13/2013      Sig:  PLACE 1 TABLET UNDER THE TONGUE EVERY 5 MINUTES UP TO 3 DOSES    Class:  Normal    DAW:  No    Comment:  Marland KitchenMarland KitchenPatient needs to contact office to schedule  Appointment  for future refills.Ph:[phone]. Thank you.    Authorizing Provider:  Peter M Martinique, MD    Ordering User:  Guinevere Ferrari     NO OFFICE VISIT SCHEDULED

## 2017-01-14 ENCOUNTER — Encounter: Payer: Self-pay | Admitting: Cardiovascular Disease

## 2017-01-14 ENCOUNTER — Ambulatory Visit (INDEPENDENT_AMBULATORY_CARE_PROVIDER_SITE_OTHER): Payer: 59 | Admitting: Cardiovascular Disease

## 2017-01-14 VITALS — BP 160/80 | HR 68 | Ht 69.0 in | Wt 190.0 lb

## 2017-01-14 DIAGNOSIS — Z955 Presence of coronary angioplasty implant and graft: Secondary | ICD-10-CM

## 2017-01-14 DIAGNOSIS — I25118 Atherosclerotic heart disease of native coronary artery with other forms of angina pectoris: Secondary | ICD-10-CM

## 2017-01-14 DIAGNOSIS — E782 Mixed hyperlipidemia: Secondary | ICD-10-CM | POA: Diagnosis not present

## 2017-01-14 DIAGNOSIS — I10 Essential (primary) hypertension: Secondary | ICD-10-CM | POA: Diagnosis not present

## 2017-01-14 DIAGNOSIS — R0602 Shortness of breath: Secondary | ICD-10-CM

## 2017-01-14 DIAGNOSIS — Z9289 Personal history of other medical treatment: Secondary | ICD-10-CM

## 2017-01-14 DIAGNOSIS — I209 Angina pectoris, unspecified: Secondary | ICD-10-CM | POA: Diagnosis not present

## 2017-01-14 DIAGNOSIS — I252 Old myocardial infarction: Secondary | ICD-10-CM | POA: Diagnosis not present

## 2017-01-14 MED ORDER — ROSUVASTATIN CALCIUM 10 MG PO TABS: 10.0000 mg | ORAL_TABLET | Freq: Every day | ORAL | 3 refills | Status: DC

## 2017-01-14 MED ORDER — NITROGLYCERIN 0.4 MG SL SUBL: SUBLINGUAL_TABLET | SUBLINGUAL | 3 refills | Status: DC

## 2017-01-14 MED ORDER — METOPROLOL TARTRATE 25 MG PO TABS: 25.0000 mg | ORAL_TABLET | Freq: Two times a day (BID) | ORAL | 3 refills | Status: DC

## 2017-01-14 NOTE — Patient Instructions (Signed)
Your physician recommends that you schedule a follow-up appointment in:  6 weeks Dr Bronson Ing   Your physician has requested that you have an echocardiogram. Echocardiography is a painless test that uses sound waves to create images of your heart. It provides your doctor with information about the size and shape of your heart and how well your heart's chambers and valves are working. This procedure takes approximately one hour. There are no restrictions for this procedure.  Your physician has requested that you have a lexiscan myoview. For further information please visit [email or web address]. Please follow instruction sheet, as given.   START Crestor 10 mg at dinner  START Metoprolol 25 mg twice a day  I refilled your nitro   Get FASTING Lipids   Thank you for choosing [facility] !

## 2017-01-14 NOTE — Progress Notes (Signed)
CARDIOLOGY CONSULT NOTE  Patient ID: Juan Martinez MRN: [id number] DOB/AGE: 63/01/1956 63 y.o.  Admit date: (Not on file) Primary Physician: [place] [place] Referring Physician: [facility]  Reason for Consultation: Shortness of breath, coronary disease  HPI: Juan Martinez is a 61 y.o. male who is being seen today for the evaluation of coronary artery disease and shortness of breath at the request of [facility] ED.   He was reportedly evaluated in the ED at [facility] and I personally reviewed all records, documentation, labs, and studies.  He went to the ED complaining of substernal chest tightness and shortness of breath. Blood pressure 152/89, heart rate 80, O2 sats 98%. Chest x-ray showed borderline cardiomegaly with mild hyperinflation of lungs.  Pertinent labs: Sodium 137, potassium 4.2, BUN 18, creatinine 1.14, N-terminal proBNP 380, white blood cells 6.1, hemoglobin 14.6, platelets 142. Troponin was normal.  I personally interpreted the ECG performed on 01/13/17 which showed sinus rhythm with old inferior infarct, T wave inversions in leads III and aVF with nonspecific T-wave abnormalities in leads II and V6.  He was last evaluated in our office in January 2013. He stopped taking his cardiac medications and did not follow-up with [place]. He has a history of coronary artery disease and non-STEMI with bare metal stent to the RCA in January 2012. He underwent repeat coronary angiography in September 2012 for a non-STEMI and he had 100% occlusion of the midcircumflex after the OM1.  Last reported LVEF 40-45% by left ventriculography in 03/2011.  ECG performed in the office today which I ordered and personally interpreted demonstrated sinus rhythm with old inferior infarct and inferior T-wave inversions suggestive of ischemia.  Last night he was given albuterol nebulizer and was started on steroids. He may have been given an antibiotic IV as well. He  is feeling better today. He had been experiencing chest pain and shortness of breath when climbing stairs and he had a nonproductive cough. He denied fevers and chills.  He has not seen a PCP in over 5 years. 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 He seldom has chest pain. He does have chronic exertional dyspnea. He smokes 1.5 packs of cigarettes daily and has done so for decades.  He is here with his wife who always accompanies her mother to her office visits with me.  No Known Allergies  Current Outpatient Prescriptions  Medication Sig Dispense Refill  . acetaminophen (TYLENOL) 500 MG tablet Take 1,000 mg by mouth every 6 (six) hours as needed. For headache     . albuterol (PROVENTIL HFA;VENTOLIN HFA) 108 (90 Base) MCG/ACT inhaler Inhale 2 puffs into the lungs every 4 (four) hours as needed for wheezing or shortness of breath.    Marland Kitchen aspirin 81 MG tablet Take 81 mg by mouth daily.      . Hydrocodone-Chlorpheniramine 5-4 MG/5ML SOLN Take by mouth 2 (two) times daily.    . predniSONE (DELTASONE) 20 MG tablet Take 20 mg by mouth 2 (two) times daily with a meal.    . NITROSTAT 0.4 MG SL tablet PLACE 1 TABLET UNDER THE TONGUE EVERY 5 MINUTES UP TO 3 DOSES (Patient not taking: Reported on 01/14/2017) 25 tablet 0  . rosuvastatin (CRESTOR) 10 MG tablet Take 1 tablet (10 mg total) by mouth daily. 90 tablet 3   No current facility-administered medications for this visit.  Past Medical History:  Diagnosis Date  . CAD (coronary artery disease), native coronary artery 03/2011   NSTEMI of Cx   . Coronary artery disease 07/2010   NSTEMI of RCA inferior  . Hypertension   . MI (myocardial infarction) [facility])     Past Surgical History:  Procedure Laterality Date  . CARDIAC CATHETERIZATION  03/21/2011   Single vessel occlusive atheroscleotic CAD.  The mid CX is occluded which is a new finding compared to January of 2012. Continued patency fo  the stent in the RCA. Moderate LV dysfx with EF of 40-45%.  . CAROTID STENT    . CORONARY ANGIOPLASTY  07/2010   BMS to RCA     Social History   Social History  . Marital status: Single    Spouse name: N/A  . Number of children: N/A  . Years of education: N/A   Occupational History  . Not on file.   Social History Main Topics  . Smoking status: Current Every Day Smoker    Packs/day: 0.50    Years: 20.00  . Smokeless tobacco: Never Used  . Alcohol use No  . Drug use: No  . Sexual activity: Yes   Other Topics Concern  . Not on file   Social History Narrative  . No narrative on file     No family history of premature CAD in 1st degree relatives.  Current Meds  Medication Sig  . acetaminophen (TYLENOL) 500 MG tablet Take 1,000 mg by mouth every 6 (six) hours as needed. For headache   . albuterol (PROVENTIL HFA;VENTOLIN HFA) 108 (90 Base) MCG/ACT inhaler Inhale 2 puffs into the lungs every 4 (four) hours as needed for wheezing or shortness of breath.  Marland Kitchen aspirin 81 MG tablet Take 81 mg by mouth daily.    . Hydrocodone-Chlorpheniramine 5-4 MG/5ML SOLN Take by mouth 2 (two) times daily.  . predniSONE (DELTASONE) 20 MG tablet Take 20 mg by mouth 2 (two) times daily with a meal.      Review of systems complete and found to be negative unless listed above in HPI    Physical exam Blood pressure (!) 160/80, pulse 68, height 5\' 9"  (1.753 m), weight 190 lb (86.2 kg), SpO2 97 %. General: NAD Neck: No JVD, no thyromegaly or thyroid nodule.  Lungs: Diminished throughout, no crackles or wheezes. CV: Nondisplaced PMI. Regular rate and rhythm, normal S1/S2, no S3/S4, no murmur.  No peripheral edema.  No carotid bruit.    Abdomen: Soft, nontender, no distention.  Skin: Intact without lesions or rashes.  Neurologic: Alert and oriented x 3.  Psych: Normal affect. Extremities: No clubbing or cyanosis.  HEENT: Poor dentition  ECG: Most recent ECG reviewed.   Labs: Lab Results    Component Value Date/Time   K 4.1 03/23/2011 05:18 AM   BUN 17 03/23/2011 05:18 AM   CREATININE 0.87 03/23/2011 05:18 AM   ALT 23 03/21/2011 06:38 PM   TSH 1.071 03/21/2011 06:38 PM   HGB 14.6 03/23/2011 05:18 AM     Lipids: Lab Results  Component Value Date/Time   LDLCALC 118 (H) 03/22/2011 01:57 AM   CHOL 175 03/22/2011 01:57 AM   TRIG 96 03/22/2011 01:57 AM   HDL 38 (L) 03/22/2011 01:57 AM        ASSESSMENT AND PLAN:  1. Coronary artery disease with history of non-STEMI, RCA stent, and mid circumflex occlusion: He does admit to taking aspirin 81 mg daily. I will start metoprolol 25 mg twice daily  and Crestor 10 mg daily. He did not tolerate Lipitor in the past. I will prescribe sublingual nitroglycerin. I will obtain an echocardiogram to evaluate cardiac structure and function. I will obtain a Lexiscan Myoview stress test to evaluate for ischemia. I strongly encouraged tobacco cessation.  2. Hyperlipidemia: I will check lipids. I will start Crestor 10 mg daily.  3. Essential hypertension: Blood pressure is elevated but he is also on his own. I will monitor this and adjust medications as needed.  4. Tobacco abuse: Cessation counseling provided (3 minutes).  Disposition: Follow up in 6 weeks   Signed: Kate Sable, M.D., F.A.C.C.  01/14/2017, 2:07 PM

## 2017-02-03 ENCOUNTER — Encounter (HOSPITAL_COMMUNITY)
Admission: RE | Admit: 2017-02-03 | Discharge: 2017-02-03 | Disposition: A | Discharge status: Home / Self Care | Payer: 59 | Source: Ambulatory Visit | Attending: Cardiovascular Disease | Admitting: Cardiovascular Disease

## 2017-02-03 ENCOUNTER — Ambulatory Visit (HOSPITAL_BASED_OUTPATIENT_CLINIC_OR_DEPARTMENT_OTHER)
Admission: RE | Admit: 2017-02-03 | Discharge: 2017-02-03 | Disposition: A | Discharge status: Home / Self Care | Payer: 59 | Source: Ambulatory Visit | Attending: Cardiovascular Disease | Admitting: Cardiovascular Disease

## 2017-02-03 ENCOUNTER — Encounter (HOSPITAL_COMMUNITY): Payer: Self-pay

## 2017-02-03 ENCOUNTER — Encounter (HOSPITAL_BASED_OUTPATIENT_CLINIC_OR_DEPARTMENT_OTHER)
Admission: RE | Admit: 2017-02-03 | Discharge: 2017-02-03 | Disposition: A | Discharge status: Home / Self Care | Payer: 59 | Source: Ambulatory Visit | Attending: Cardiovascular Disease | Admitting: Cardiovascular Disease

## 2017-02-03 DIAGNOSIS — F17210 Nicotine dependence, cigarettes, uncomplicated: Secondary | ICD-10-CM

## 2017-02-03 DIAGNOSIS — R0602 Shortness of breath: Secondary | ICD-10-CM | POA: Diagnosis not present

## 2017-02-03 DIAGNOSIS — I209 Angina pectoris, unspecified: Secondary | ICD-10-CM

## 2017-02-03 DIAGNOSIS — I081 Rheumatic disorders of both mitral and tricuspid valves: Secondary | ICD-10-CM

## 2017-02-03 DIAGNOSIS — I25119 Atherosclerotic heart disease of native coronary artery with unspecified angina pectoris: Secondary | ICD-10-CM | POA: Insufficient documentation

## 2017-02-03 DIAGNOSIS — E7800 Pure hypercholesterolemia, unspecified: Secondary | ICD-10-CM | POA: Insufficient documentation

## 2017-02-03 LAB — NM MYOCAR MULTI W/SPECT W/WALL MOTION / EF
CHL CUP NUCLEAR SDS: 0
CHL CUP NUCLEAR SRS: 12
CHL CUP NUCLEAR SSS: 12
CSEPPHR: 90 {beats}/min
LV dias vol: 182 mL (ref 62–150)
LV sys vol: 105 mL
RATE: 0.37
Rest HR: 56 {beats}/min
TID: 1.08

## 2017-02-03 MED ORDER — TECHNETIUM TC 99M TETROFOSMIN IV KIT
30.0000 | PACK | Freq: Once | INTRAVENOUS | Status: AC | PRN
  Administered 2017-02-03: 32.3 via INTRAVENOUS

## 2017-02-03 MED ORDER — TECHNETIUM TC 99M TETROFOSMIN IV KIT
10.0000 | PACK | Freq: Once | INTRAVENOUS | Status: AC | PRN
Start: 2017-02-03 — End: 2017-02-03
  Administered 2017-02-03: 9.8 via INTRAVENOUS

## 2017-02-03 MED ORDER — SODIUM CHLORIDE 0.9% FLUSH
INTRAVENOUS | Status: AC
  Administered 2017-02-03: 10 mL via INTRAVENOUS
  Filled 2017-02-03: qty 10

## 2017-02-03 MED ORDER — REGADENOSON 0.4 MG/5ML IV SOLN
INTRAVENOUS | Status: AC
  Administered 2017-02-03: 0.4 mg via INTRAVENOUS
  Filled 2017-02-03: qty 5

## 2017-02-03 NOTE — Progress Notes (Signed)
*  PRELIMINARY RESULTS* Echocardiogram 2D Echocardiogram has been performed.  Juan Martinez 02/03/2017, 10:03 AM

## 2017-02-04 ENCOUNTER — Telehealth: Payer: Self-pay | Admitting: *Deleted

## 2017-02-04 ENCOUNTER — Telehealth: Payer: Self-pay | Admitting: Cardiovascular Disease

## 2017-02-04 NOTE — Telephone Encounter (Signed)
Pt's girlfriend called to receive test results given to pt yesterday. She states pt did not understand his results. When asked If DPR has been signed girlfriend became upset and stated that she is given results when she comes with her mother to her visits. She then stated loudly " I will call "Roldan and have him tell you to give me the results!" . Girlfriend then ended the call. Nurse then called pt to explain results further. Nurses explained that pt will need to sign DPR if he wishes to have results released to girlfriend. Pt voiced understanding and stated that he would sign at next OV. Patient encouraged to call office with any questions.

## 2017-02-04 NOTE — Telephone Encounter (Signed)
Returned call to Judson Roch (daughter).  Did not see designated release in chart so was unable to review test results with her.  She will have her mother call back tomorrow as her dad did not understand what was told to him.  Also, reminded to have him sign DPR next time he is in the office.

## 2017-02-04 NOTE — Telephone Encounter (Signed)
Daughter called asking to have test results explained to her.  Stated that her dad did not understand when explained to him yesterday.

## 2017-02-10 ENCOUNTER — Telehealth: Payer: Self-pay | Admitting: *Deleted

## 2017-02-10 NOTE — Telephone Encounter (Signed)
Notes recorded by Laurine Blazer, LPN on 3/77/9396 at 8:86 AM EDT Patient notified. Copy to pmd. Follow up scheduled for 02/24/2017 with Dr. Bronson Ing. ------  Notes recorded by Herminio Commons, MD on 02/03/2017 at 4:34 PM EDT Evidence of old heart attack. No new blockages. Will manage medically.

## 2017-02-24 ENCOUNTER — Encounter: Payer: Self-pay | Admitting: Cardiovascular Disease

## 2017-02-24 ENCOUNTER — Other Ambulatory Visit: Payer: Self-pay | Admitting: Cardiovascular Disease

## 2017-02-24 ENCOUNTER — Ambulatory Visit (INDEPENDENT_AMBULATORY_CARE_PROVIDER_SITE_OTHER): Payer: 59 | Admitting: Cardiovascular Disease

## 2017-02-24 VITALS — BP 148/86 | HR 62 | Ht 69.0 in | Wt 190.2 lb

## 2017-02-24 DIAGNOSIS — I209 Angina pectoris, unspecified: Secondary | ICD-10-CM

## 2017-02-24 DIAGNOSIS — E782 Mixed hyperlipidemia: Secondary | ICD-10-CM

## 2017-02-24 DIAGNOSIS — I252 Old myocardial infarction: Secondary | ICD-10-CM

## 2017-02-24 DIAGNOSIS — Z955 Presence of coronary angioplasty implant and graft: Secondary | ICD-10-CM | POA: Diagnosis not present

## 2017-02-24 DIAGNOSIS — Z720 Tobacco use: Secondary | ICD-10-CM | POA: Diagnosis not present

## 2017-02-24 DIAGNOSIS — I10 Essential (primary) hypertension: Secondary | ICD-10-CM

## 2017-02-24 DIAGNOSIS — I25118 Atherosclerotic heart disease of native coronary artery with other forms of angina pectoris: Secondary | ICD-10-CM

## 2017-02-24 LAB — LIPID PANEL
CHOL/HDL RATIO: 2.8 ratio (ref <5.0)
Cholesterol: 122 mg/dL (ref <200)
HDL: 43 mg/dL (ref >40)
LDL CALC: 64 mg/dL (ref <100)
TRIGLYCERIDES: 74 mg/dL (ref <150)
VLDL: 15 mg/dL (ref <30)

## 2017-02-24 MED ORDER — LISINOPRIL 5 MG PO TABS: 5.0000 mg | ORAL_TABLET | Freq: Every day | ORAL | 6 refills | Status: DC

## 2017-02-24 NOTE — Addendum Note (Signed)
Addended by: Laurine Blazer on: 02/24/2017 09:15 AM   Modules accepted: Orders

## 2017-02-24 NOTE — Patient Instructions (Addendum)
Medication Instructions:   Begin Lisinopril 5mg  daily.  Continue all other medications.    Labwork:  Lipids - order given today.  Reminder:  Nothing to eat or drink after 12 midnight prior to labs.  Office will contact with results via phone or letter.    Testing/Procedures: none  Follow-Up: Your physician wants you to follow up in: 6 months.  You will receive a reminder letter in the mail one-two months in advance.  If you don't receive a letter, please call our office to schedule the follow up appointment   Any Other Special Instructions Will Be Listed Below (If Applicable).  If you need a refill on your cardiac medications before your next appointment, please call your pharmacy.

## 2017-02-24 NOTE — Progress Notes (Signed)
SUBJECTIVE: The patient returns for follow-up after undergoing cardiovascular testing performed for the evaluation of CAD.  He has a history of coronary artery disease and non-STEMI with bare metal stent to the RCA in January 2012. He underwent repeat coronary angiography in September 2012 for a non-STEMI and he had 100% occlusion of the midcircumflex after the OM1.  Nuclear stress test 02/03/17 showed large inferior wall infarct from apex to base with no evidence of ischemia, LVEF 42%.  Echocardiogram showed mildly reduced left ventricular systolic function, LVEF 18-29%, mid and basal inferior wall akinesis, and mild mitral regurgitation.  He did not get his lipids checked. He walks 1-2 miles daily at work without exertional chest pain. He rarely uses his albuterol inhaler. He has not had used nitroglycerin.    Review of Systems: As per "subjective", otherwise negative.  No Known Allergies  Current Outpatient Prescriptions  Medication Sig Dispense Refill  . acetaminophen (TYLENOL) 500 MG tablet Take 1,000 mg by mouth every 6 (six) hours as needed. For headache     . albuterol (PROVENTIL HFA;VENTOLIN HFA) 108 (90 Base) MCG/ACT inhaler Inhale 2 puffs into the lungs every 4 (four) hours as needed for wheezing or shortness of breath.    Marland Kitchen aspirin 81 MG tablet Take 81 mg by mouth daily.      . metoprolol tartrate (LOPRESSOR) 25 MG tablet Take 1 tablet (25 mg total) by mouth 2 (two) times daily. 180 tablet 3  . nitroGLYCERIN (NITROSTAT) 0.4 MG SL tablet PLACE 1 TABLET UNDER THE TONGUE EVERY 5 MINUTES UP TO 3 DOSES 25 tablet 3  . rosuvastatin (CRESTOR) 10 MG tablet Take 1 tablet (10 mg total) by mouth daily. 90 tablet 3   No current facility-administered medications for this visit.     Past Medical History:  Diagnosis Date  . CAD (coronary artery disease), native coronary artery 03/2011   NSTEMI of Cx   . Coronary artery disease 07/2010   NSTEMI of RCA inferior  . Hypertension   .  MI (myocardial infarction) [facility])     Past Surgical History:  Procedure Laterality Date  . CARDIAC CATHETERIZATION  03/21/2011   Single vessel occlusive atheroscleotic CAD.  The mid CX is occluded which is a new finding compared to January of 2012. Continued patency fo the stent in the RCA. Moderate LV dysfx with EF of 40-45%.  . CAROTID STENT    . CORONARY ANGIOPLASTY  07/2010   BMS to RCA     Social History   Social History  . Marital status: Single    Spouse name: N/A  . Number of children: N/A  . Years of education: N/A   Occupational History  . Not on file.   Social History Main Topics  . Smoking status: Current Every Day Smoker    Packs/day: 0.50    Years: 20.00  . Smokeless tobacco: Never Used  . Alcohol use No  . Drug use: No  . Sexual activity: Yes   Other Topics Concern  . Not on file   Social History Narrative  . No narrative on file     Vitals:   02/24/17 0844  BP: (!) 148/86  Pulse: 62  SpO2: 97%  Weight: 190 lb 3.2 oz (86.3 kg)  Height: 5\' 9"  (1.753 m)    Wt Readings from Last 3 Encounters:  02/24/17 190 lb 3.2 oz (86.3 kg)  01/14/17 190 lb (86.2 kg)  12/25/12 196 lb (88.9 kg)  PHYSICAL EXAM General: NAD HEENT: Normal. Neck: No JVD, no thyromegaly. Lungs: Diminished throughout, no crackles or wheezes. CV: Nondisplaced PMI.  Regular rate and rhythm, normal S1/S2, no S3/S4, no murmur. No pretibial or periankle edema.  No carotid bruit.   Abdomen: Soft, nontender, no distention.  Neurologic: Alert and oriented.  Psych: Normal affect. Skin: Normal. Musculoskeletal: No gross deformities.    ECG: Most recent ECG reviewed.   Labs: Lab Results  Component Value Date/Time   K 4.1 03/23/2011 05:18 AM   BUN 17 03/23/2011 05:18 AM   CREATININE 0.87 03/23/2011 05:18 AM   ALT 23 03/21/2011 06:38 PM   TSH 1.071 03/21/2011 06:38 PM   HGB 14.6 03/23/2011 05:18 AM     Lipids: Lab Results  Component Value Date/Time   LDLCALC 118 (H)  03/22/2011 01:57 AM   CHOL 175 03/22/2011 01:57 AM   TRIG 96 03/22/2011 01:57 AM   HDL 38 (L) 03/22/2011 01:57 AM       ASSESSMENT AND PLAN:  1. Coronary artery disease with history of non-STEMI, RCA stent, and mid circumflex occlusion: Symptomatically stable. Nuclear stress test 02/03/17 showed large inferior wall infarct from apex to base with no evidence of ischemia, LVEF 42%. Continue aspirin, metoprolol, and Crestor. I will start lisinopril. LVEF 45-50%.  2. Hyperlipidemia: Contniue Crestor 10 mg daily. He did not obtain lipids which I previously ordered. I will give him a new order.  3. Essential hypertension: Blood pressure is again elevated. I will start lisinopril 5 mg daily. GFR normal on 01/13/17.  4. Tobacco abuse: Cessation counseling previously provided.     Disposition: Follow up 6 months   Kate Sable, M.D., F.A.C.C.

## 2017-02-28 ENCOUNTER — Telehealth: Payer: Self-pay | Admitting: Cardiovascular Disease

## 2017-02-28 NOTE — Telephone Encounter (Signed)
Called again.......

## 2017-02-28 NOTE — Telephone Encounter (Signed)
Returned call

## 2017-02-28 NOTE — Telephone Encounter (Signed)
Notes recorded by Laurine Blazer, LPN on 9/81/0254 at 8:62 PM EDT Patient notified via voice mail. Copy to pmd. ------  Notes recorded by Laurine Blazer, LPN on 02/22/1752 at 01:04 AM EDT Left message to return call. ------  Notes recorded by Herminio Commons, MD on 02/24/2017 at 4:44 PM EDT Normal.

## 2017-07-07 ENCOUNTER — Other Ambulatory Visit: Payer: Self-pay

## 2017-07-09 ENCOUNTER — Telehealth: Payer: Self-pay | Admitting: *Deleted

## 2017-07-09 MED ORDER — SIMVASTATIN 40 MG PO TABS: 40.0000 mg | ORAL_TABLET | Freq: Every day | ORAL | 3 refills | Status: DC

## 2017-07-09 NOTE — Telephone Encounter (Signed)
Noted, will send new prescription back to [facility]

## 2017-07-09 NOTE — Telephone Encounter (Signed)
- 

## 2017-07-09 NOTE — Telephone Encounter (Signed)
Received fax from [place] - requesting alternative medication for his Crestor.  Patient having to pay $125 for this out of pocket.  Can he be changed to one of the following choices:   Simvastatin - $30 Atorvastatin - $45 Pravastatin -  $71

## 2017-07-11 ENCOUNTER — Other Ambulatory Visit: Payer: Self-pay | Admitting: *Deleted

## 2017-07-11 MED ORDER — LISINOPRIL 5 MG PO TABS: 5.0000 mg | ORAL_TABLET | Freq: Every day | ORAL | 3 refills | Status: DC

## 2017-08-13 ENCOUNTER — Other Ambulatory Visit: Payer: Self-pay | Admitting: Cardiovascular Disease

## 2017-09-08 ENCOUNTER — Ambulatory Visit (INDEPENDENT_AMBULATORY_CARE_PROVIDER_SITE_OTHER): Payer: 59 | Admitting: Cardiovascular Disease

## 2017-09-08 ENCOUNTER — Encounter: Payer: Self-pay | Admitting: Cardiovascular Disease

## 2017-09-08 VITALS — BP 142/92 | HR 63 | Ht 69.0 in | Wt 194.0 lb

## 2017-09-08 DIAGNOSIS — I25118 Atherosclerotic heart disease of native coronary artery with other forms of angina pectoris: Secondary | ICD-10-CM

## 2017-09-08 DIAGNOSIS — Z720 Tobacco use: Secondary | ICD-10-CM | POA: Diagnosis not present

## 2017-09-08 DIAGNOSIS — I252 Old myocardial infarction: Secondary | ICD-10-CM | POA: Diagnosis not present

## 2017-09-08 DIAGNOSIS — R454 Irritability and anger: Secondary | ICD-10-CM

## 2017-09-08 DIAGNOSIS — Z955 Presence of coronary angioplasty implant and graft: Secondary | ICD-10-CM | POA: Diagnosis not present

## 2017-09-08 DIAGNOSIS — E782 Mixed hyperlipidemia: Secondary | ICD-10-CM

## 2017-09-08 DIAGNOSIS — I10 Essential (primary) hypertension: Secondary | ICD-10-CM | POA: Diagnosis not present

## 2017-09-08 MED ORDER — SIMVASTATIN 40 MG PO TABS: 40.0000 mg | ORAL_TABLET | Freq: Every day | ORAL | 11 refills | Status: DC

## 2017-09-08 MED ORDER — LOSARTAN POTASSIUM 25 MG PO TABS: 25.0000 mg | ORAL_TABLET | Freq: Every day | ORAL | 11 refills | Status: DC

## 2017-09-08 NOTE — Patient Instructions (Signed)
Medication Instructions:   Stop Lisinopril.  Begin Losartan 25mg  daily.   Continue all other medications.    Labwork: none  Testing/Procedures: none  Follow-Up: Your physician wants you to follow up in:  1 year.  You will receive a reminder letter in the mail one-two months in advance.  If you don't receive a letter, please call our office to schedule the follow up appointment   Any Other Special Instructions Will Be Listed Below (If Applicable).  If you need a refill on your cardiac medications before your next appointment, please call your pharmacy.

## 2017-09-08 NOTE — Progress Notes (Signed)
SUBJECTIVE: The patient presents for routine follow-up. He has a history of coronary artery disease and non-STEMI with bare metal stent to the RCA in January 2012. He underwent repeat coronary angiography in September 2012 for a non-STEMI and he had 100% occlusion of the midcircumflex after the OM1.  Nuclear stress test 02/03/17 showed large inferior wall infarct from apex to base with no evidence of ischemia, LVEF 42%.  Echocardiogram showed mildly reduced left ventricular systolic function, LVEF 84-16%, mid and basal inferior wall akinesis, and mild mitral regurgitation.  He denies chest pain and has not had to use any nitroglycerin.  He has been working 6 days/week without difficulty.  He occasionally gets dizzy only when coughing.  He said he has been more irritable for the past 5 or 6 months and thinks it is due to one of the medications.  That is about the time I started lisinopril.  He also complains of having to pay $125 for Crestor although I switched him to simvastatin for this reason back on January 9.   Review of Systems: As per "subjective", otherwise negative.  No Known Allergies  Current Outpatient Medications  Medication Sig Dispense Refill  . acetaminophen (TYLENOL) 500 MG tablet Take 1,000 mg by mouth every 6 (six) hours as needed. For headache     . albuterol (PROVENTIL HFA;VENTOLIN HFA) 108 (90 Base) MCG/ACT inhaler Inhale 2 puffs into the lungs every 4 (four) hours as needed for wheezing or shortness of breath.    Marland Kitchen aspirin 81 MG tablet Take 81 mg by mouth daily.      Marland Kitchen lisinopril (PRINIVIL,ZESTRIL) 5 MG tablet Take 1 tablet (5 mg total) by mouth daily. 90 tablet 3  . metoprolol tartrate (LOPRESSOR) 25 MG tablet Take 1 tablet (25 mg total) by mouth 2 (two) times daily. 180 tablet 3  . nitroGLYCERIN (NITROSTAT) 0.4 MG SL tablet PLACE 1 TABLET UNDER THE TONGUE EVERY 5 MINUTES UP TO 3 DOSES 25 tablet 3  . rosuvastatin (CRESTOR) 10 MG tablet TAKE 1 TABLET BY MOUTH  EVERY DAY 90 tablet 0   No current facility-administered medications for this visit.     Past Medical History:  Diagnosis Date  . CAD (coronary artery disease), native coronary artery 03/2011   NSTEMI of Cx   . Coronary artery disease 07/2010   NSTEMI of RCA inferior  . Hypertension   . MI (myocardial infarction) [facility])     Past Surgical History:  Procedure Laterality Date  . CARDIAC CATHETERIZATION  03/21/2011   Single vessel occlusive atheroscleotic CAD.  The mid CX is occluded which is a new finding compared to January of 2012. Continued patency fo the stent in the RCA. Moderate LV dysfx with EF of 40-45%.  . CAROTID STENT    . CORONARY ANGIOPLASTY  07/2010   BMS to RCA     Social History   Socioeconomic History  . Marital status: Single    Spouse name: Not on file  . Number of children: Not on file  . Years of education: Not on file  . Highest education level: Not on file  Social Needs  . Financial resource strain: Not on file  . Food insecurity - worry: Not on file  . Food insecurity - inability: Not on file  . Transportation needs - medical: Not on file  . Transportation needs - non-medical: Not on file  Occupational History  . Not on file  Tobacco Use  . Smoking status: Current Every  Day Smoker    Packs/day: 0.50    Years: 20.00    Pack years: 10.00  . Smokeless tobacco: Never Used  Substance and Sexual Activity  . Alcohol use: No  . Drug use: No  . Sexual activity: Yes  Other Topics Concern  . Not on file  Social History Narrative  . Not on file     Vitals:   09/08/17 0820  BP: (!) 142/92  Pulse: 63  SpO2: 98%  Weight: 194 lb (88 kg)  Height: 5\' 9"  (1.753 m)    Wt Readings from Last 3 Encounters:  09/08/17 194 lb (88 kg)  02/24/17 190 lb 3.2 oz (86.3 kg)  01/14/17 190 lb (86.2 kg)     PHYSICAL EXAM General: NAD HEENT: Normal. Neck: No JVD, no thyromegaly. Lungs: Diminished throughout, faint scattered inspiratory wheezes. CV: Regular  rate and rhythm, normal S1/S2, no S3/S4, no murmur. No pretibial or periankle edema.  No carotid bruit.   Abdomen: Soft, nontender, no distention.  Neurologic: Alert and oriented.  Psych: Normal affect. Skin: Normal. Musculoskeletal: No gross deformities.    ECG: Most recent ECG reviewed.   Labs: Lab Results  Component Value Date/Time   K 4.1 03/23/2011 05:18 AM   BUN 17 03/23/2011 05:18 AM   CREATININE 0.87 03/23/2011 05:18 AM   ALT 23 03/21/2011 06:38 PM   TSH 1.071 03/21/2011 06:38 PM   HGB 14.6 03/23/2011 05:18 AM     Lipids: Lab Results  Component Value Date/Time   LDLCALC 64 02/24/2017 10:07 AM   CHOL 122 02/24/2017 10:07 AM   TRIG 74 02/24/2017 10:07 AM   HDL 43 02/24/2017 10:07 AM       ASSESSMENT AND PLAN:  1. Coronary artery disease with history of non-STEMI, RCA stent, and mid circumflex occlusion: Symptomatically stable. Nuclear stress test 02/03/17 showed large inferior wall infarct from apex to base with no evidence of ischemia, LVEF 42%. Continue aspirin and metoprolol.  He should be on simvastatin 40 mg rather than Crestor due to cost.  I asked him to contact his pharmacy about this since we already sent the prescription on 07/09/17.  Due to symptoms of irritability, I will switch lisinopril to losartan 25 mg. LVEF 45-50%.  2. Hyperlipidemia:  He should be on simvastatin 40 mg rather than Crestor due to cost.  I asked him to contact his pharmacy about this since we already sent the prescription on 07/09/17.  LDL 64 on 02/24/17.  3. Essential hypertension: Blood pressure is mildly elevated. Due to symptoms of irritability, I will switch lisinopril to losartan 25 mg.   4. Tobacco abuse: Cessation counseling previously provided.     Disposition: Follow up 1 year   Kate Sable, M.D., F.A.C.C.

## 2017-11-29 ENCOUNTER — Encounter: Payer: Self-pay | Admitting: Internal Medicine

## 2017-12-02 ENCOUNTER — Encounter (INDEPENDENT_AMBULATORY_CARE_PROVIDER_SITE_OTHER): Payer: Self-pay | Admitting: Internal Medicine

## 2017-12-02 ENCOUNTER — Ambulatory Visit (INDEPENDENT_AMBULATORY_CARE_PROVIDER_SITE_OTHER): Payer: 59 | Admitting: Internal Medicine

## 2017-12-02 ENCOUNTER — Encounter (INDEPENDENT_AMBULATORY_CARE_PROVIDER_SITE_OTHER): Payer: Self-pay | Admitting: *Deleted

## 2017-12-02 ENCOUNTER — Telehealth (INDEPENDENT_AMBULATORY_CARE_PROVIDER_SITE_OTHER): Payer: Self-pay | Admitting: *Deleted

## 2017-12-02 VITALS — BP 158/84 | HR 52 | Temp 98.1°F | Ht 68.0 in | Wt 186.7 lb

## 2017-12-02 DIAGNOSIS — R197 Diarrhea, unspecified: Secondary | ICD-10-CM | POA: Insufficient documentation

## 2017-12-02 DIAGNOSIS — K625 Hemorrhage of anus and rectum: Secondary | ICD-10-CM | POA: Insufficient documentation

## 2017-12-02 DIAGNOSIS — Z800 Family history of malignant neoplasm of digestive organs: Secondary | ICD-10-CM

## 2017-12-02 MED ORDER — PEG 3350-KCL-NA BICARB-NACL 420 G PO SOLR: 4000.0000 mL | Freq: Once | ORAL | 0 refills | Status: AC

## 2017-12-02 NOTE — Patient Instructions (Signed)
The risks of bleeding, perforation and infection were reviewed with patient.  

## 2017-12-02 NOTE — Progress Notes (Signed)
Subjective:    Patient ID: Juan Martinez, male    DOB: [birth year], 62 y.o.   MRN: [id number]  HPI Here today for f/u after recent visit to the ED this past Saturday.  He was having diarrhea. The diarrhea started about 12 weeks ago. He says everytime he eats, he has to have a BM. No fever. He says he had abdominal pain.  Stool very foul smelling. He was dizzy and had weakness in the ED. He received IV fluids and Zofran for nausea. He underwent a CT which he reports was fine. He states blood work was normal. (I will get those records).  He also states today when he has a BM, he is seeing blood. He has been seeing blood x 2 weeks. Describes the blood as a lot at times.  He says he was told he could have had a gatroenteritis. He says he is 100% better but he continues to have blood in his stools . His stools are not formed now however and he continues to see blood. He is having about 2-3 stools a day after meals. Each time he has BM, he sees blood.  Before he became sick, his stools were normal.   Family hx of colon cancer in a mother in her 38s. He has never undergone a colonoscopy.  No recent antibiotics  11/29/2017 Lipase 27. K 3.8, Chloride 107.  H and H 13.1 and 37.5  Hx of hypertension, CAD, MI Cardiac stent x 1.   Review of Systems Past Medical History:  Diagnosis Date  . CAD (coronary artery disease), native coronary artery 03/2011   NSTEMI of Cx   . Coronary artery disease 07/2010   NSTEMI of RCA inferior  . Hypertension   . MI (myocardial infarction) [facility])     Past Surgical History:  Procedure Laterality Date  . CARDIAC CATHETERIZATION  03/21/2011   Single vessel occlusive atheroscleotic CAD.  The mid CX is occluded which is a new finding compared to January of 2012. Continued patency fo the stent in the RCA. Moderate LV dysfx with EF of 40-45%.  . CAROTID STENT    . CORONARY ANGIOPLASTY  07/2010   BMS to RCA     No Known Allergies  Current Outpatient Medications on File  Prior to Visit  Medication Sig Dispense Refill  . acetaminophen (TYLENOL) 500 MG tablet Take 1,000 mg by mouth every 6 (six) hours as needed. For headache     . albuterol (PROVENTIL HFA;VENTOLIN HFA) 108 (90 Base) MCG/ACT inhaler Inhale 2 puffs into the lungs every 4 (four) hours as needed for wheezing or shortness of breath.    Marland Kitchen aspirin 81 MG tablet Take 81 mg by mouth daily.      Marland Kitchen losartan (COZAAR) 25 MG tablet Take 1 tablet (25 mg total) by mouth daily. 30 tablet 11  . metoprolol succinate (TOPROL-XL) 25 MG 24 hr tablet Take 25 mg by mouth 2 (two) times daily.    . nitroGLYCERIN (NITROSTAT) 0.4 MG SL tablet PLACE 1 TABLET UNDER THE TONGUE EVERY 5 MINUTES UP TO 3 DOSES 25 tablet 3  . ondansetron (ZOFRAN) 8 MG tablet Take by mouth every 8 (eight) hours as needed for nausea or vomiting.    . simvastatin (ZOCOR) 40 MG tablet Take 1 tablet (40 mg total) by mouth at bedtime. 30 tablet 11  . metoprolol tartrate (LOPRESSOR) 25 MG tablet Take 1 tablet (25 mg total) by mouth 2 (two) times daily. 180 tablet 3  No current facility-administered medications on file prior to visit.         Objective:   Physical Exam Blood pressure (!) 158/84, pulse (!) 52, temperature 98.1 F (36.7 C), height 5\' 8"  (1.727 m), weight 186 lb 11.2 oz (84.7 kg). Alert and oriented. Skin warm and dry. Oral mucosa is moist.   . Sclera anicteric, conjunctivae is pink. Thyroid not enlarged. No cervical lymphadenopathy. Lungs clear. Heart regular rate and rhythm.  Abdomen is soft. Bowel sounds are positive. No hepatomegaly. No abdominal masses felt. No tenderness.  No edema to lower extremities.  Rectal exam: No masses. Stool brown and guaiac positive.          Assessment & Plan:  Diarrhea. Guaiac positive stool. Am going to get a GI pathogen. There is a family hx of colon cancer so we will set him up for a colonoscopy.

## 2017-12-02 NOTE — Telephone Encounter (Signed)
Patient needs trilyte 

## 2017-12-04 LAB — GASTROINTESTINAL PATHOGEN PANEL PCR
C. difficile Tox A/B, PCR: NOT DETECTED
CAMPYLOBACTER, PCR: NOT DETECTED
Cryptosporidium, PCR: NOT DETECTED
E COLI (ETEC) LT/ST, PCR: NOT DETECTED
E coli (STEC) stx1/stx2, PCR: NOT DETECTED
E coli 0157, PCR: NOT DETECTED
Giardia lamblia, PCR: NOT DETECTED
Norovirus, PCR: NOT DETECTED
Rotavirus A, PCR: NOT DETECTED
SALMONELLA, PCR: NOT DETECTED
SHIGELLA, PCR: NOT DETECTED

## 2017-12-13 ENCOUNTER — Other Ambulatory Visit: Payer: Self-pay

## 2017-12-13 ENCOUNTER — Encounter (HOSPITAL_COMMUNITY): Payer: Self-pay | Admitting: Emergency Medicine

## 2017-12-13 ENCOUNTER — Emergency Department (HOSPITAL_COMMUNITY)
Admission: EM | Admit: 2017-12-13 | Discharge: 2017-12-13 | Disposition: A | Discharge status: Home / Self Care | Payer: 59 | Attending: Emergency Medicine | Admitting: Emergency Medicine

## 2017-12-13 DIAGNOSIS — I252 Old myocardial infarction: Secondary | ICD-10-CM | POA: Diagnosis not present

## 2017-12-13 DIAGNOSIS — I10 Essential (primary) hypertension: Secondary | ICD-10-CM | POA: Diagnosis not present

## 2017-12-13 DIAGNOSIS — Z7982 Long term (current) use of aspirin: Secondary | ICD-10-CM | POA: Diagnosis not present

## 2017-12-13 DIAGNOSIS — I2510 Atherosclerotic heart disease of native coronary artery without angina pectoris: Secondary | ICD-10-CM | POA: Insufficient documentation

## 2017-12-13 DIAGNOSIS — F17200 Nicotine dependence, unspecified, uncomplicated: Secondary | ICD-10-CM | POA: Insufficient documentation

## 2017-12-13 DIAGNOSIS — Z79899 Other long term (current) drug therapy: Secondary | ICD-10-CM | POA: Diagnosis not present

## 2017-12-13 DIAGNOSIS — E7800 Pure hypercholesterolemia, unspecified: Secondary | ICD-10-CM | POA: Diagnosis not present

## 2017-12-13 DIAGNOSIS — R55 Syncope and collapse: Secondary | ICD-10-CM | POA: Diagnosis present

## 2017-12-13 DIAGNOSIS — E860 Dehydration: Secondary | ICD-10-CM | POA: Diagnosis not present

## 2017-12-13 LAB — CBC
HCT: 42.2 % (ref 39.0–52.0)
Hemoglobin: 13.9 g/dL (ref 13.0–17.0)
MCH: 30.8 pg (ref 26.0–34.0)
MCHC: 32.9 g/dL (ref 30.0–36.0)
MCV: 93.6 fL (ref 78.0–100.0)
Platelets: 225 10*3/uL (ref 150–400)
RBC: 4.51 MIL/uL (ref 4.22–5.81)
RDW: 12.7 % (ref 11.5–15.5)
WBC: 6.7 10*3/uL (ref 4.0–10.5)

## 2017-12-13 LAB — BASIC METABOLIC PANEL
Anion gap: 10 (ref 5–15)
BUN: 14 mg/dL (ref 6–20)
CALCIUM: 9.1 mg/dL (ref 8.9–10.3)
CO2: 25 mmol/L (ref 22–32)
CREATININE: 1.32 mg/dL — AB (ref 0.61–1.24)
Chloride: 105 mmol/L (ref 101–111)
GFR calc Af Amer: >60 mL/min (ref >60)
GFR calc non Af Amer: 55 mL/min — ABNORMAL LOW (ref >60)
Glucose, Bld: 132 mg/dL — ABNORMAL HIGH (ref 65–99)
Potassium: 4.8 mmol/L (ref 3.5–5.1)
Sodium: 140 mmol/L (ref 135–145)

## 2017-12-13 LAB — CBG MONITORING, ED: GLUCOSE-CAPILLARY: 106 mg/dL — AB (ref 65–99)

## 2017-12-13 MED ORDER — SODIUM CHLORIDE 0.9 % IV BOLUS
1000.0000 mL | Freq: Once | INTRAVENOUS | Status: AC
  Administered 2017-12-13: 1000 mL via INTRAVENOUS

## 2017-12-13 NOTE — ED Provider Notes (Signed)
[facility] EMERGENCY DEPARTMENT Provider Note   CSN: [id number] Arrival date & time: 12/13/17  1816     History   Chief Complaint Chief Complaint  Patient presents with  . Loss of Consciousness    HPI Juan Martinez is a 62 y.o. male.     62 year old male presents with syncope.  He states that he was in church and was feeling tired during the homily.  When he stood up he started all of a sudden feel lightheaded.  It lasted for a couple minutes and then it was so bad he had to sit down.  Once he sat back into the pew he briefly passed out.  He was out for a couple seconds.  He felt very hot afterwards.  He did not have any chest pain, shortness of breath, headache, palpitations.  No weakness or numbness.  EMS noted him to be orthostatic.  Currently he feels fine.  He tells me that he drank a cup of coffee and 2 beers today but has not drank hardly any water.  He states he was also working outside this afternoon.  Past Medical History:  Diagnosis Date  . CAD (coronary artery disease), native coronary artery 03/2011   NSTEMI of Cx   . Coronary artery disease 07/2010   NSTEMI of RCA inferior  . Hypertension   . MI (myocardial infarction) [facility])     Patient Active Problem List   Diagnosis Date Noted  . Diarrhea 12/02/2017  . Rectal bleeding 12/02/2017  . Family hx of colon cancer 12/02/2017  . Hypercholesterolemia 04/01/2011  . Tobacco abuse 04/01/2011  . CAD 08/06/2010    Past Surgical History:  Procedure Laterality Date  . CARDIAC CATHETERIZATION  03/21/2011   Single vessel occlusive atheroscleotic CAD.  The mid CX is occluded which is a new finding compared to January of 2012. Continued patency fo the stent in the RCA. Moderate LV dysfx with EF of 40-45%.  . CAROTID STENT    . CORONARY ANGIOPLASTY  07/2010   BMS to RCA         Home Medications    Prior to Admission medications   Medication Sig Start Date End Date Taking? Authorizing Provider    acetaminophen (TYLENOL) 500 MG tablet Take 1,000 mg by mouth every 6 (six) hours as needed. For headache     [provider]  albuterol (PROVENTIL HFA;VENTOLIN HFA) 108 (90 Base) MCG/ACT inhaler Inhale 2 puffs into the lungs every 4 (four) hours as needed for wheezing or shortness of breath.    [provider]  aspirin 81 MG tablet Take 81 mg by mouth daily.      [provider]  Aspirin-Caffeine 845-65 MG PACK Take by mouth.    [provider]  losartan (COZAAR) 25 MG tablet Take 1 tablet (25 mg total) by mouth daily. 09/08/17 12/07/17  Herminio Commons, MD  metoprolol succinate (TOPROL-XL) 25 MG 24 hr tablet Take 25 mg by mouth 2 (two) times daily.    [provider]  metoprolol tartrate (LOPRESSOR) 25 MG tablet Take 1 tablet (25 mg total) by mouth 2 (two) times daily. 01/14/17 09/08/17  Herminio Commons, MD  nitroGLYCERIN (NITROSTAT) 0.4 MG SL tablet PLACE 1 TABLET UNDER THE TONGUE EVERY 5 MINUTES UP TO 3 DOSES 01/14/17   Herminio Commons, MD  ondansetron (ZOFRAN) 8 MG tablet Take by mouth every 8 (eight) hours as needed for nausea or vomiting.    [provider]  simvastatin (ZOCOR) 40 MG tablet Take 1 tablet (40 mg total) by mouth at bedtime. 09/08/17 12/07/17  Herminio Commons, MD    Family History Family History  Problem Relation Age of Onset  . Heart disease Mother     Social History Social History   Tobacco Use  . Smoking status: Current Every Day Smoker    Packs/day: 0.50    Years: 20.00    Pack years: 10.00  . Smokeless tobacco: Never Used  Substance Use Topics  . Alcohol use: No  . Drug use: No     Allergies   Patient has no known allergies.   Review of Systems Review of Systems  Constitutional: Negative for fever.  Respiratory: Negative for shortness of breath.   Cardiovascular: Negative for chest pain.  Gastrointestinal: Negative for abdominal pain and vomiting.  Genitourinary: Negative for  dysuria.  Neurological: Positive for syncope and light-headedness. Negative for weakness, numbness and headaches.  All other systems reviewed and are negative.    Physical Exam Updated Vital Signs BP 123/85 (BP Location: Right Arm)   Pulse 67   Temp 98.3 F (36.8 C) (Oral)   Resp 19   Ht 5\' 7"  (1.702 m)   Wt 77.1 kg (170 lb)   SpO2 98%   BMI 26.63 kg/m   Physical Exam  Constitutional: He is oriented to person, place, and time. He appears well-developed and well-nourished. No distress.  HENT:  Head: Normocephalic and atraumatic.  Right Ear: External ear normal.  Left Ear: External ear normal.  Nose: Nose normal.  Eyes: Pupils are equal, round, and reactive to light. EOM are normal. Right eye exhibits no discharge. Left eye exhibits no discharge.  Neck: Neck supple.  Cardiovascular: Normal rate, regular rhythm and normal heart sounds.  No murmur heard. Pulmonary/Chest: Effort normal and breath sounds normal.  Abdominal: Soft. There is no tenderness.  Musculoskeletal: He exhibits no edema.  Neurological: He is alert and oriented to person, place, and time.  CN 3-12 grossly intact. 5/5 strength in all 4 extremities. Grossly normal sensation. Normal finger to nose.   Skin: Skin is warm and dry. He is not diaphoretic.  Nursing note and vitals reviewed.    ED Treatments / Results  Labs (all labs ordered are listed, but only abnormal results are displayed) Labs Reviewed  BASIC METABOLIC PANEL - Abnormal; Notable for the following components:      Result Value   Glucose, Bld 132 (*)    Creatinine, Ser 1.32 (*)    GFR calc non Af Amer 55 (*)    All other components within normal limits  CBG MONITORING, ED - Abnormal; Notable for the following components:   Glucose-Capillary 106 (*)    All other components within normal limits  CBC    EKG EKG Interpretation  Date/Time:  Saturday December 13 2017 18:27:39 EDT Ventricular Rate:  66 PR Interval:    QRS Duration: 125 QT  Interval:  477 QTC Calculation: 500 R Axis:   68 Text Interpretation:  Sinus rhythm Nonspecific intraventricular conduction delay Anteroseptal infarct, age indeterminate ST elevations no longer present compared to 2012 Confirmed by Sherwood Gambler [phone]) on 12/13/2017 6:44:44 PM   Radiology No results found.  Procedures Procedures (including critical care time)  Medications Ordered in ED Medications  sodium chloride 0.9 % bolus 1,000 mL (0 mLs Intravenous Stopped 12/13/17 2015)     Initial Impression / Assessment and Plan / ED Course  I have reviewed the triage vital signs and  the nursing notes.  Pertinent labs & imaging results that were available during my care of the patient were reviewed by me and considered in my medical decision making (see chart for details).     The patient's syncope is likely related to poor p.o. intake throughout the day including 2 beers. He then stood up too quickly, likely inducing orthostasis.  Currently he feels fine.  His vital signs are normal.  He has a mild bump in his creatinine from last evaluation but this is from several years ago.  However he does not appear to have renal failure.  He was given IV fluids.  I have stressed that he needs to have better hydration and keep out of the sun during these hot times.  Follow-up with PCP.  I highly doubt ACS, PE, or cardiac arrhythmia.  Discussed return precautions.  Final Clinical Impressions(s) / ED Diagnoses   Final diagnoses:  Dehydration  Syncope and collapse    ED Discharge Orders    None       Sherwood Gambler, MD 12/13/17 2138

## 2017-12-13 NOTE — ED Triage Notes (Signed)
Per EMS, pt coming from church for a syncopal episode. + LOC. Pt was sitting in the pews, no injuries noted. Pt denies hitting head. Pt denies any pain. Pt was dizzy and lightheaded. Positive orthostatics by EMS. BP standing 80s/40s. EMS gave 228mls fluid. Pt A&Ox4.  EMS VS BP 130/86 sitting, HR 70s, 97% room air.

## 2017-12-13 NOTE — Discharge Instructions (Addendum)
Your creatinine today is 1.32.  This is a marker of your kidney function.  Call your doctor on Monday, 6/17, to compare to their baseline.  Be sure to drink plenty of fluids.  Follow-up with your doctor in about 1 week.  If you develop recurrent symptoms or if you noticed heart palpitations, skipping beats, chest pain, shortness of breath, headache or other new/concerning symptoms and return to the ER for evaluation.

## 2017-12-13 NOTE — ED Notes (Signed)
Pt verbalizes understanding of d/c instructions. Pt ambulatory at d/c with all belongings and with family.   

## 2017-12-29 ENCOUNTER — Encounter (HOSPITAL_COMMUNITY)
Admission: RE | Disposition: A | Discharge status: Home / Self Care | Payer: Self-pay | Source: Ambulatory Visit | Attending: Internal Medicine

## 2017-12-29 ENCOUNTER — Encounter (HOSPITAL_COMMUNITY): Payer: Self-pay | Admitting: *Deleted

## 2017-12-29 ENCOUNTER — Ambulatory Visit (HOSPITAL_COMMUNITY)
Admission: RE | Admit: 2017-12-29 | Discharge: 2017-12-29 | Disposition: A | Discharge status: Home / Self Care | Payer: 59 | Source: Ambulatory Visit | Attending: Internal Medicine | Admitting: Internal Medicine

## 2017-12-29 ENCOUNTER — Other Ambulatory Visit: Payer: Self-pay

## 2017-12-29 DIAGNOSIS — Z800 Family history of malignant neoplasm of digestive organs: Secondary | ICD-10-CM

## 2017-12-29 DIAGNOSIS — K6389 Other specified diseases of intestine: Secondary | ICD-10-CM | POA: Insufficient documentation

## 2017-12-29 DIAGNOSIS — I252 Old myocardial infarction: Secondary | ICD-10-CM | POA: Diagnosis not present

## 2017-12-29 DIAGNOSIS — K635 Polyp of colon: Secondary | ICD-10-CM | POA: Diagnosis not present

## 2017-12-29 DIAGNOSIS — K219 Gastro-esophageal reflux disease without esophagitis: Secondary | ICD-10-CM | POA: Insufficient documentation

## 2017-12-29 DIAGNOSIS — R197 Diarrhea, unspecified: Secondary | ICD-10-CM

## 2017-12-29 DIAGNOSIS — K648 Other hemorrhoids: Secondary | ICD-10-CM

## 2017-12-29 DIAGNOSIS — I2510 Atherosclerotic heart disease of native coronary artery without angina pectoris: Secondary | ICD-10-CM | POA: Insufficient documentation

## 2017-12-29 DIAGNOSIS — D125 Benign neoplasm of sigmoid colon: Secondary | ICD-10-CM

## 2017-12-29 DIAGNOSIS — F17210 Nicotine dependence, cigarettes, uncomplicated: Secondary | ICD-10-CM | POA: Diagnosis not present

## 2017-12-29 DIAGNOSIS — I10 Essential (primary) hypertension: Secondary | ICD-10-CM | POA: Diagnosis not present

## 2017-12-29 DIAGNOSIS — Z7982 Long term (current) use of aspirin: Secondary | ICD-10-CM | POA: Diagnosis not present

## 2017-12-29 DIAGNOSIS — K625 Hemorrhage of anus and rectum: Secondary | ICD-10-CM

## 2017-12-29 DIAGNOSIS — Z79899 Other long term (current) drug therapy: Secondary | ICD-10-CM | POA: Insufficient documentation

## 2017-12-29 DIAGNOSIS — J449 Chronic obstructive pulmonary disease, unspecified: Secondary | ICD-10-CM | POA: Diagnosis not present

## 2017-12-29 HISTORY — DX: Chronic obstructive pulmonary disease, unspecified: J44.9

## 2017-12-29 HISTORY — PX: COLONOSCOPY: SHX5424

## 2017-12-29 HISTORY — DX: Gastro-esophageal reflux disease without esophagitis: K21.9

## 2017-12-29 HISTORY — PX: POLYPECTOMY: SHX5525

## 2017-12-29 HISTORY — DX: Dyspnea, unspecified: R06.00

## 2017-12-29 SURGERY — COLONOSCOPY
Anesthesia: Moderate Sedation

## 2017-12-29 MED ORDER — MEPERIDINE HCL 50 MG/ML IJ SOLN
INTRAMUSCULAR | Status: AC
  Filled 2017-12-29: qty 1

## 2017-12-29 MED ORDER — MIDAZOLAM HCL 5 MG/5ML IJ SOLN
INTRAMUSCULAR | Status: AC
  Filled 2017-12-29: qty 10

## 2017-12-29 MED ORDER — NALOXONE HCL 0.4 MG/ML IJ SOLN
INTRAMUSCULAR | Status: AC
  Filled 2017-12-29: qty 1

## 2017-12-29 MED ORDER — STERILE WATER FOR IRRIGATION IR SOLN
Status: DC | PRN
  Administered 2017-12-29: 08:00:00

## 2017-12-29 MED ORDER — ATROPINE SULFATE 1 MG/ML IJ SOLN
INTRAMUSCULAR | Status: AC
  Filled 2017-12-29: qty 1

## 2017-12-29 MED ORDER — EPINEPHRINE PF 1 MG/10ML IJ SOSY
PREFILLED_SYRINGE | INTRAMUSCULAR | Status: AC
  Filled 2017-12-29: qty 10

## 2017-12-29 MED ORDER — SODIUM CHLORIDE 0.9 % IV SOLN
INTRAVENOUS | Status: DC
  Administered 2017-12-29: 07:00:00 via INTRAVENOUS

## 2017-12-29 MED ORDER — MEPERIDINE HCL 50 MG/ML IJ SOLN
INTRAMUSCULAR | Status: DC | PRN
  Administered 2017-12-29 (×2): 25 mg via INTRAVENOUS

## 2017-12-29 MED ORDER — MIDAZOLAM HCL 5 MG/5ML IJ SOLN
INTRAMUSCULAR | Status: DC | PRN
  Administered 2017-12-29 (×3): 2 mg via INTRAVENOUS

## 2017-12-29 MED ORDER — FLUMAZENIL 0.5 MG/5ML IV SOLN
INTRAVENOUS | Status: AC
  Filled 2017-12-29: qty 5

## 2017-12-29 NOTE — Op Note (Signed)
[facility] Patient Name: Juan Martinez Procedure Date: 12/29/2017 7:08 AM MRN: [id number] Date of Birth: 1955-12-28 Attending MD: Hildred Laser , MD CSN: [id number] Age: 62 Admit Type: Outpatient Procedure:                Colonoscopy Indications:              Rectal bleeding Providers:                Hildred Laser, MD, Lurline Del, RN, Aram Candela Referring MD:             Jake Samples, PA Medicines:                Meperidine 50 mg IV, Midazolam 6 mg IV Complications:            No immediate complications. Estimated Blood Loss:     Estimated blood loss was minimal. Procedure:                Pre-Anesthesia Assessment:                           - Prior to the procedure, a History and Physical                            was performed, and patient medications and                            allergies were reviewed. The patient's tolerance of                            previous anesthesia was also reviewed. The risks                            and benefits of the procedure and the sedation                            options and risks were discussed with the patient.                            All questions were answered, and informed consent                            was obtained. Prior Anticoagulants: The patient                            last took aspirin 3 days prior to the procedure.                            ASA Grade Assessment: II - A patient with mild                            systemic disease. After reviewing the risks and                            benefits, the patient was deemed in satisfactory  condition to undergo the procedure.                           After obtaining informed consent, the colonoscope                            was passed under direct vision. Throughout the                            procedure, the patient's blood pressure, pulse, and                            oxygen saturations were monitored continuously. The                        [id number] ([id number]) scope was introduced through                            the anus and advanced to the the cecum, identified                            by appendiceal orifice and ileocecal valve. The                            colonoscopy was performed without difficulty. The                            patient tolerated the procedure well. The quality                            of the bowel preparation was excellent. The                            ileocecal valve, appendiceal orifice, and rectum                            were photographed. Scope In: 7:48:20 AM Scope Out: 8:07:17 AM Scope Withdrawal Time: 0 hours 15 minutes 17 seconds  Total Procedure Duration: 0 hours 18 minutes 57 seconds  Findings:      The perianal and digital rectal examinations were normal.      A small polyp was found in the mid sigmoid colon. The polyp was sessile.       Biopsies were taken with a cold forceps for histology.      The exam was otherwise normal throughout the examined colon.      Internal hemorrhoids were found during retroflexion. The hemorrhoids       were small. Impression:               - One small polyp in the mid sigmoid colon.                            Biopsied.                           - Internal hemorrhoids. Moderate Sedation:      Moderate (  conscious) sedation was administered by the endoscopy nurse       and supervised by the endoscopist. The following parameters were       monitored: oxygen saturation, heart rate, blood pressure, CO2       capnography and response to care. Total physician intraservice time was       26 minutes. Recommendation:           - Patient has a contact number available for                            emergencies. The signs and symptoms of potential                            delayed complications were discussed with the                            patient. Return to normal activities tomorrow.                            Written  discharge instructions were provided to the                            patient.                           - Resume previous diet today.                           - Continue present medications.                           - No aspirin, ibuprofen, naproxen, or other                            non-steroidal anti-inflammatory drugs for 1 day.                           - Await pathology results.                           - Repeat colonoscopy for surveillance based on                            pathology results. Procedure Code(s):        --- Professional ---                           [phone], Colonoscopy, flexible; with biopsy, single                            or multiple                           G0500, Moderate sedation services provided by the                            same physician or other qualified  health care                            professional performing a gastrointestinal                            endoscopic service that sedation supports,                            requiring the presence of an independent trained                            observer to assist in the monitoring of the                            patient's level of consciousness and physiological                            status; initial 15 minutes of intra-service time;                            patient age 78 years or older (additional time may                            be reported with [phone], as appropriate)                           [phone], Moderate sedation services provided by the                            same physician or other qualified health care                            professional performing the diagnostic or                            therapeutic service that the sedation supports,                            requiring the presence of an independent trained                            observer to assist in the monitoring of the                            patient's level of consciousness and  physiological                            status; each additional 15 minutes intraservice                            time (List separately in addition to code for  primary service) Diagnosis Code(s):        --- Professional ---                           D12.5, Benign neoplasm of sigmoid colon                           K64.8, Other hemorrhoids                           K62.5, Hemorrhage of anus and rectum CPT copyright 2017 [employer]. All rights reserved. The codes documented in this report are preliminary and upon coder review may  be revised to meet current compliance requirements. Hildred Laser, MD Hildred Laser, MD 12/29/2017 8:19:53 AM This report has been signed electronically. Number of Addenda: 0

## 2017-12-29 NOTE — Discharge Instructions (Signed)
Colon Polyps Polyps are tissue growths inside the body. Polyps can grow in many places, including the large intestine (colon). A polyp may be a round bump or a mushroom-shaped growth. You could have one polyp or several. Most colon polyps are noncancerous (benign). However, some colon polyps can become cancerous over time. What are the causes? The exact cause of colon polyps is not known. What increases the risk? This condition is more likely to develop in people who:  Have a family history of colon cancer or colon polyps.  Are older than 34 or older than 45 if they are African American.  Have inflammatory bowel disease, such as ulcerative colitis or Crohn disease.  Are overweight.  Smoke cigarettes.  Do not get enough exercise.  Drink too much alcohol.  Eat a diet that is: ? High in fat and red meat. ? Low in fiber.  Had childhood cancer that was treated with abdominal radiation.  What are the signs or symptoms? Most polyps do not cause symptoms. If you have symptoms, they may include:  Blood coming from your rectum when having a bowel movement.  Blood in your stool.The stool may look dark red or black.  A change in bowel habits, such as constipation or diarrhea.  How is this diagnosed? This condition is diagnosed with a colonoscopy. This is a procedure that uses a lighted, flexible scope to look at the inside of your colon. How is this treated? Treatment for this condition involves removing any polyps that are found. Those polyps will then be tested for cancer. If cancer is found, your health care provider will talk to you about options for colon cancer treatment. Follow these instructions at home: Diet  Eat plenty of fiber, such as fruits, vegetables, and whole grains.  Eat foods that are high in calcium and vitamin D, such as milk, cheese, yogurt, eggs, liver, fish, and broccoli.  Limit foods high in fat, red meats, and processed meats, such as hot dogs, sausage,  bacon, and lunch meats.  Maintain a healthy weight, or lose weight if recommended by your health care provider. General instructions  Do not smoke cigarettes.  Do not drink alcohol excessively.  Keep all follow-up visits as told by your health care provider. This is important. This includes keeping regularly scheduled colonoscopies. Talk to your health care provider about when you need a colonoscopy.  Exercise every day or as told by your health care provider. Contact a health care provider if:  You have new or worsening bleeding during a bowel movement.  You have new or increased blood in your stool.  You have a change in bowel habits.  You unexpectedly lose weight. This information is not intended to replace advice given to you by your health care provider. Make sure you discuss any questions you have with your health care provider. Document Released: 03/13/2004 Document Revised: 11/23/2015 Document Reviewed: 05/08/2015 Elsevier Interactive Patient Education  2018 [employer]. Colonoscopy, Adult, Care After This sheet gives you information about how to care for yourself after your procedure. Your health care provider may also give you more specific instructions. If you have problems or questions, contact your health care provider. What can I expect after the procedure? After the procedure, it is common to have:  A small amount of blood in your stool for 24 hours after the procedure.  Some gas.  Mild abdominal cramping or bloating.  Follow these instructions at home: General instructions   For the first 24 hours after  the procedure: ? Do not drive or use machinery. ? Do not sign important documents. ? Do not drink alcohol. ? Do your regular daily activities at a slower pace than normal. ? Eat soft, easy-to-digest foods. ? Rest often.  Take over-the-counter or prescription medicines only as told by your health care provider.  It is up to you to get the results of  your procedure. Ask your health care provider, or the department performing the procedure, when your results will be ready. Relieving cramping and bloating  Try walking around when you have cramps or feel bloated.  Apply heat to your abdomen as told by your health care provider. Use a heat source that your health care provider recommends, such as a moist heat pack or a heating pad. ? Place a towel between your skin and the heat source. ? Leave the heat on for 20-30 minutes. ? Remove the heat if your skin turns bright red. This is especially important if you are unable to feel pain, heat, or cold. You may have a greater risk of getting burned. Eating and drinking  Drink enough fluid to keep your urine clear or pale yellow.  Resume your normal diet as instructed by your health care provider. Avoid heavy or fried foods that are hard to digest.  Avoid drinking alcohol for as long as instructed by your health care provider. Contact a health care provider if:  You have blood in your stool 2-3 days after the procedure. Get help right away if:  You have more than a small spotting of blood in your stool.  You pass large blood clots in your stool.  Your abdomen is swollen.  You have nausea or vomiting.  You have a fever.  You have increasing abdominal pain that is not relieved with medicine. This information is not intended to replace advice given to you by your health care provider. Make sure you discuss any questions you have with your health care provider. Document Released: 01/30/2004 Document Revised: 03/11/2016 Document Reviewed: 08/29/2015 Elsevier Interactive Patient Education  2018 [employer]. Resume aspirin on 12/30/2017. Resume other medications as before. Resume usual diet. No driving for 24 hours. Physician will call with biopsy results.

## 2017-12-29 NOTE — H&P (Signed)
Juan Martinez is an 62 y.o. male.   Chief Complaint: Patient is here for colonoscopy. HPI: Patient is 62 year old Caucasian male who had an episode of diarrhea lasting few weeks associated with rectal bleeding when he noted small amount of blood with his bowel movements.  He states he did not have abdominal pain fever or chills.  His bowels are now normal and he did not pass any more blood.  He is on low-dose aspirin which is on hold. This is patient's first exam. Family history significant for CRC in mother who was 40 at the time of diagnosis and died within 85 months of metastatic disease.  Past Medical History:  Diagnosis Date  . CAD (coronary artery disease), native coronary artery 03/2011   NSTEMI of Cx   . COPD (chronic obstructive pulmonary disease) ([place])   . Coronary artery disease 07/2010   NSTEMI of RCA inferior  . Dyspnea   . GERD (gastroesophageal reflux disease)   . Hypertension   . MI (myocardial infarction) [facility])     Past Surgical History:  Procedure Laterality Date  . CARDIAC CATHETERIZATION  03/21/2011   Single vessel occlusive atheroscleotic CAD.  The mid CX is occluded which is a new finding compared to January of 2012. Continued patency fo the stent in the RCA. Moderate LV dysfx with EF of 40-45%.  . CAROTID STENT    . CORONARY ANGIOPLASTY  07/2010   BMS to RCA   . Kidney operation as a child      Family History  Problem Relation Age of Onset  . Heart disease Mother   . Colon cancer Mother    Social History:  reports that he has been smoking cigarettes.  He has a 78.00 pack-year smoking history. He has never used smokeless tobacco. He reports that he does not drink alcohol or use drugs.  Allergies: No Known Allergies  Medications Prior to Admission  Medication Sig Dispense Refill  . aspirin 81 MG tablet Take 81 mg by mouth daily.      Marland Kitchen losartan (COZAAR) 25 MG tablet Take 1 tablet (25 mg total) by mouth daily. 30 tablet 11  . metoprolol tartrate (LOPRESSOR)  25 MG tablet Take 1 tablet (25 mg total) by mouth 2 (two) times daily. 180 tablet 3  . simvastatin (ZOCOR) 40 MG tablet Take 1 tablet (40 mg total) by mouth at bedtime. 30 tablet 11  . acetaminophen (TYLENOL) 500 MG tablet Take 1,000 mg by mouth every 6 (six) hours as needed. For headache     . albuterol (PROVENTIL HFA;VENTOLIN HFA) 108 (90 Base) MCG/ACT inhaler Inhale 2 puffs into the lungs every 4 (four) hours as needed for wheezing or shortness of breath.    . nitroGLYCERIN (NITROSTAT) 0.4 MG SL tablet PLACE 1 TABLET UNDER THE TONGUE EVERY 5 MINUTES UP TO 3 DOSES 25 tablet 3    No results found for this or any previous visit (from the past 48 hour(s)). No results found.  ROS  Blood pressure (!) 146/87, pulse 74, resp. rate 13, height 5\' 9"  (1.753 m), weight 180 lb (81.6 kg), SpO2 100 %. Physical Exam  Constitutional: He appears well-developed and well-nourished.  HENT:  Mouth/Throat: Oropharynx is clear and moist.  Eyes: No scleral icterus.  Neck: No thyromegaly present.  Cardiovascular: Normal rate, regular rhythm and normal heart sounds.  No murmur heard. Respiratory: Effort normal and breath sounds normal.  GI:  Abdomen is symmetrical with lower abdominal scar.  It is soft and nontender  without organomegaly or masses.  Musculoskeletal: He exhibits no edema.  Lymphadenopathy:    He has no cervical adenopathy.  Neurological: He is alert.  Skin: Skin is warm and dry.     Assessment/Plan Rectal bleeding. Family history of CRC in first-degree relative at late onset. Diagnostic colonoscopy.  Hildred Laser, MD 12/29/2017, 7:35 AM

## 2018-01-02 ENCOUNTER — Encounter (HOSPITAL_COMMUNITY): Payer: Self-pay | Admitting: Internal Medicine

## 2018-01-25 ENCOUNTER — Other Ambulatory Visit: Payer: Self-pay | Admitting: Cardiovascular Disease

## 2018-03-05 IMAGING — NM NM MYOCAR MULTI W/SPECT W/WALL MOTION & EF
2 series · 12 of 12 positions shown · non-contrast
Comparison: none

[Series 1: rest · 6.51mm/px · 6 of 64 frames shown]
[frame 6/64]
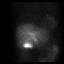
[frame 16/64]
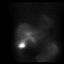
[frame 27/64]
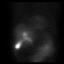
[frame 38/64]
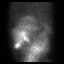
[frame 48/64]
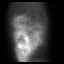
[frame 59/64]
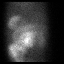

[Series 2: stress gated · 6.51mm/px · 6 of 512 frames shown]
[frame 43/512]
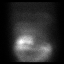
[frame 128/512]
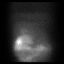
[frame 214/512]
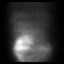
[frame 299/512]
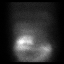
[frame 384/512]
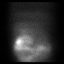
[frame 470/512]
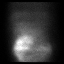

[12 of 12 positions shown; findings below may reference images not displayed]

Canned report from images found in remote index.

Refer to host system for actual result text.

## 2018-07-05 ENCOUNTER — Other Ambulatory Visit: Payer: Self-pay | Admitting: Cardiovascular Disease

## 2018-08-31 ENCOUNTER — Other Ambulatory Visit: Payer: Self-pay | Admitting: *Deleted

## 2018-08-31 MED ORDER — METOPROLOL TARTRATE 25 MG PO TABS: 25.0000 mg | ORAL_TABLET | Freq: Two times a day (BID) | ORAL | 0 refills | Status: DC

## 2018-09-16 ENCOUNTER — Other Ambulatory Visit: Payer: Self-pay | Admitting: *Deleted

## 2018-09-16 MED ORDER — SIMVASTATIN 40 MG PO TABS: 40.0000 mg | ORAL_TABLET | Freq: Every day | ORAL | 1 refills | Status: DC

## 2018-10-12 ENCOUNTER — Other Ambulatory Visit: Payer: Self-pay

## 2018-10-12 MED ORDER — LOSARTAN POTASSIUM 25 MG PO TABS: 25.0000 mg | ORAL_TABLET | Freq: Every day | ORAL | 3 refills | Status: DC

## 2018-10-12 NOTE — Telephone Encounter (Signed)
Refilled losartan 

## 2018-10-13 ENCOUNTER — Other Ambulatory Visit: Payer: Self-pay

## 2018-10-19 ENCOUNTER — Other Ambulatory Visit: Payer: Self-pay | Admitting: Cardiovascular Disease

## 2018-10-19 MED ORDER — NITROGLYCERIN 0.4 MG SL SUBL: SUBLINGUAL_TABLET | SUBLINGUAL | 3 refills | Status: DC

## 2018-10-19 NOTE — Telephone Encounter (Signed)
Refilled ntg °

## 2018-10-19 NOTE — Telephone Encounter (Signed)
°*  STAT* If patient is at the pharmacy, call can be transferred to refill team.   1. Which medications need to be refilled? (please list name of each medication and dose if known) Nitroglycerin 0.4mg  tablet SL  2. Which pharmacy/location (including street and city if local pharmacy) is medication to be sent to?[facility]  3. Do they need a 30 day or 90 day supply? 25 each

## 2018-11-25 ENCOUNTER — Other Ambulatory Visit: Payer: Self-pay | Admitting: Cardiovascular Disease

## 2018-12-06 ENCOUNTER — Other Ambulatory Visit: Payer: Self-pay | Admitting: Cardiovascular Disease

## 2019-02-19 ENCOUNTER — Telehealth (INDEPENDENT_AMBULATORY_CARE_PROVIDER_SITE_OTHER): Payer: Managed Care, Other (non HMO) | Admitting: Cardiovascular Disease

## 2019-02-19 ENCOUNTER — Encounter: Payer: Self-pay | Admitting: Cardiovascular Disease

## 2019-02-19 VITALS — Ht 69.0 in | Wt 198.0 lb

## 2019-02-19 DIAGNOSIS — E785 Hyperlipidemia, unspecified: Secondary | ICD-10-CM

## 2019-02-19 DIAGNOSIS — I25118 Atherosclerotic heart disease of native coronary artery with other forms of angina pectoris: Secondary | ICD-10-CM

## 2019-02-19 DIAGNOSIS — J449 Chronic obstructive pulmonary disease, unspecified: Secondary | ICD-10-CM

## 2019-02-19 DIAGNOSIS — I10 Essential (primary) hypertension: Secondary | ICD-10-CM

## 2019-02-19 DIAGNOSIS — Z720 Tobacco use: Secondary | ICD-10-CM

## 2019-02-19 MED ORDER — ALBUTEROL SULFATE HFA 108 (90 BASE) MCG/ACT IN AERS: 2.0000 | INHALATION_SPRAY | RESPIRATORY_TRACT | 2 refills | Status: DC | PRN

## 2019-02-19 NOTE — Addendum Note (Signed)
Addended by: Merlene Laughter on: 02/19/2019 08:58 AM   Modules accepted: Orders

## 2019-02-19 NOTE — Patient Instructions (Addendum)
Medication Instructions:   Your physician recommends that you continue on your current medications as directed. Please refer to the Current Medication list given to you today.  Labwork:  Your physician recommends that you return for a FASTING lipid profile, BMET and CBC. Please do not eat or drink for at least 8 hours when you have this done. Your lab orders have been faxed to [facility]. Please go Monday, February 22, 2019.  Testing/Procedures:  NONE  Follow-Up:  Your physician recommends that you schedule a follow-up appointment in: 1 year. You will receive a reminder letter in the mail in about 10 months reminding you to call and schedule your appointment. If you don't receive this letter, please contact our office.  Any Other Special Instructions Will Be Listed Below (If Applicable).  If you need a refill on your cardiac medications before your next appointment, please call your pharmacy.

## 2019-02-19 NOTE — Progress Notes (Signed)
Virtual Visit via Telephone Note   This visit type was conducted due to national recommendations for restrictions regarding the COVID-19 Pandemic (e.g. social distancing) in an effort to limit this patient's exposure and mitigate transmission in our community.  Due to his co-morbid illnesses, this patient is at least at moderate risk for complications without adequate follow up.  This format is felt to be most appropriate for this patient at this time.  The patient did not have access to video technology/had technical difficulties with video requiring transitioning to audio format only (telephone).  All issues noted in this document were discussed and addressed.  No physical exam could be performed with this format.  Please refer to the patient's chart for his  consent to telehealth for [facility].   Date:  02/19/2019   ID:  Juan Martinez, DOB 02-13-1956, MRN [id number]  Patient Location: Home Provider Location: Office  PCP:  [facility]  Cardiologist:  Kate Sable, MD  Electrophysiologist:  None   Evaluation Performed:  Follow-Up Visit  Chief Complaint:  CAD  History of Present Illness:    Juan Martinez is a 63 y.o. male with a history of coronary artery disease and non-STEMI with bare metal stent to the RCA in January 2012. He underwent repeat coronary angiography in September 2012 for a non-STEMI and he had 100% occlusion of the midcircumflex after the OM1.  Nuclear stress test 02/03/17 showed large inferior wall infarct from apex to base with no evidence of ischemia, LVEF 42%.  Echocardiogram showed mildly reduced left ventricular systolic function, LVEF [id number], mid and basal inferior wall akinesis, and mild mitral regurgitation.  He denies chest pain, leg swelling, palpitations, and shortness of breath.  If he looks up too quickly he may get a little dizzy.  He denies syncope.  He has run out of albuterol and requests that I refill it.  He uses it  as needed.  He has not seen his PCP in quite some time.  He also asked that I speak with his wife, Juan Martinez, whom I know well as her mother, Juan Martinez, is also my patient.  Mrs. Porterfield recently suffered a stroke and is unable to speak.  They brought her home from the hospital yesterday.  The patient does not have symptoms concerning for COVID-19 infection (fever, chills, cough, or new shortness of breath).     Past Medical History:  Diagnosis Date  . CAD (coronary artery disease), native coronary artery 03/2011   NSTEMI of Cx   . COPD (chronic obstructive pulmonary disease) ([place])   . Coronary artery disease 07/2010   NSTEMI of RCA inferior  . Dyspnea   . GERD (gastroesophageal reflux disease)   . Hypertension   . MI (myocardial infarction) [facility])    Past Surgical History:  Procedure Laterality Date  . CARDIAC CATHETERIZATION  03/21/2011   Single vessel occlusive atheroscleotic CAD.  The mid CX is occluded which is a new finding compared to January of 2012. Continued patency fo the stent in the RCA. Moderate LV dysfx with EF of 40-45%.  . CAROTID STENT    . COLONOSCOPY N/A 12/29/2017   Procedure: COLONOSCOPY;  Surgeon: Rogene Houston, MD;  [facility];  Service: Endoscopy;  Laterality: N/A;  7:30  . CORONARY ANGIOPLASTY  07/2010   BMS to RCA   . Kidney operation as a child    . POLYPECTOMY  12/29/2017   Procedure: POLYPECTOMY;  Surgeon: Rogene Houston, MD;  [facility];  Service: Endoscopy;;  sigmoid     Current Meds  Medication Sig  . acetaminophen (TYLENOL) 500 MG tablet Take 1,000 mg by mouth every 6 (six) hours as needed. For headache   . albuterol (PROVENTIL HFA;VENTOLIN HFA) 108 (90 Base) MCG/ACT inhaler Inhale 2 puffs into the lungs every 4 (four) hours as needed for wheezing or shortness of breath.  Marland Kitchen aspirin 81 MG tablet Take 81 mg by mouth daily.    Marland Kitchen ibuprofen (ADVIL) 800 MG tablet Take 1 tablet by mouth 3 (three) times daily as needed.   Marland Kitchen losartan (COZAAR) 25 MG tablet Take 1 tablet (25 mg total) by mouth daily.  . metoprolol tartrate (LOPRESSOR) 25 MG tablet TAKE 1 TABLET BY MOUTH TWICE A DAY  . nitroGLYCERIN (NITROSTAT) 0.4 MG SL tablet PLACE 1 TABLET UNDER THE TONGUE EVERY 5 MINUTES UP TO 3 DOSES  . simvastatin (ZOCOR) 40 MG tablet Take 1 tablet (40 mg total) by mouth at bedtime.  . traMADol (ULTRAM) 50 MG tablet Take 1 tablet by mouth every 6 (six) hours as needed.     Allergies:   Patient has no known allergies.   Social History   Tobacco Use  . Smoking status: Current Every Day Smoker    Packs/day: 1.50    Years: 52.00    Pack years: 78.00    Types: Cigarettes  . Smokeless tobacco: Never Used  Substance Use Topics  . Alcohol use: No  . Drug use: No     Family Hx: The patient's family history includes Colon cancer in his mother; Heart disease in his mother.  ROS:   Please see the history of present illness.     All other systems reviewed and are negative.   Prior CV studies:   The following studies were reviewed today:  See above  Labs/Other Tests and Data Reviewed:    EKG:  No ECG reviewed.  Recent Labs: No results found for requested labs within last 8760 hours.   Recent Lipid Panel Lab Results  Component Value Date/Time   CHOL 122 02/24/2017 10:07 AM   TRIG 74 02/24/2017 10:07 AM   HDL 43 02/24/2017 10:07 AM   CHOLHDL 2.8 02/24/2017 10:07 AM   LDLCALC 64 02/24/2017 10:07 AM    Wt Readings from Last 3 Encounters:  02/19/19 198 lb (89.8 kg)  12/29/17 180 lb (81.6 kg)  12/13/17 170 lb (77.1 kg)     Objective:    Vital Signs:  Ht 5\' 9"  (1.753 m)   Wt 198 lb (89.8 kg)   BMI 29.24 kg/m    VITAL SIGNS:  reviewed  ASSESSMENT & PLAN:    1. Coronary artery disease with history of non-STEMI, RCA stent, and mid circumflex occlusion:Symptomatically stable. Nuclear stress test 02/03/17 showed large inferior wall infarct from apex to base with no evidence of ischemia, LVEF 42%.  Continue aspirin, statin, beta blocker, and losartan. LVEF 45-50%.  I will check a basic metabolic panel.  2. Hyperlipidemia: Continue simvastatin. I will check lipids.  3. Essential hypertension:No changes.   4. Tobacco abuse: Cessation counselingpreviouslyprovided.  5. COPD: I will refill albuterol as per his request. He uses it prn.    COVID-19 Education: The signs and symptoms of COVID-19 were discussed with the patient and how to seek care for testing (follow up with PCP or arrange E-visit).  The importance of social distancing was discussed today.  Time:   Today, I have spent 15 minutes with the patient  with telehealth technology discussing the above problems.     Medication Adjustments/Labs and Tests Ordered: Current medicines are reviewed at length with the patient today.  Concerns regarding medicines are outlined above.   Tests Ordered: No orders of the defined types were placed in this encounter.   Medication Changes: No orders of the defined types were placed in this encounter.   Follow Up:  Virtual Visit or In Person in 1 year(s)  Signed, Kate Sable, MD  02/19/2019 8:33 AM    [place]

## 2019-02-24 ENCOUNTER — Other Ambulatory Visit: Payer: Self-pay | Admitting: Cardiovascular Disease

## 2019-03-01 ENCOUNTER — Telehealth: Payer: Self-pay | Admitting: *Deleted

## 2019-03-01 NOTE — Telephone Encounter (Signed)
Notes recorded by Laurine Blazer, LPN on 579FGE at 579FGE PM EDT  Patient notified. Copy to pmd.  ------   Notes recorded by Herminio Commons, MD on 02/26/2019 at 11:23 AM EDT  Normal renal function. Overall good results.

## 2019-04-21 ENCOUNTER — Other Ambulatory Visit: Payer: Self-pay | Admitting: Cardiovascular Disease

## 2019-10-18 ENCOUNTER — Other Ambulatory Visit: Payer: Self-pay | Admitting: Cardiovascular Disease

## 2019-11-12 ENCOUNTER — Other Ambulatory Visit: Payer: Self-pay | Admitting: Cardiovascular Disease

## 2020-02-24 DIAGNOSIS — Z7189 Other specified counseling: Secondary | ICD-10-CM | POA: Insufficient documentation

## 2020-02-24 DIAGNOSIS — E785 Hyperlipidemia, unspecified: Secondary | ICD-10-CM | POA: Insufficient documentation

## 2020-02-24 DIAGNOSIS — I10 Essential (primary) hypertension: Secondary | ICD-10-CM | POA: Insufficient documentation

## 2020-02-24 NOTE — Progress Notes (Signed)
Cardiology Office Note   Date:  02/27/2020   ID:  Juan Martinez, DOB Mar 27, 1956, MRN [id number]  PCP:  [facility]  Cardiologist:   Minus Breeding, MD   Chief Complaint  Patient presents with  . Gastroesophageal Reflux      History of Present Illness: Juan Martinez is a 64 y.o. male who presents for follow up of NSTEMI.   He was seen by Dr. Bronson Ing.  He had a BMS I the RCA in Jan 2012.  He underwent repeat coronary angiography in September 2012 for a non-STEMI and he had 100% occlusion of the midcircumflex after the OM1.  Nuclear stress test 02/03/17 showed large inferior wall infarct from apex to base with no evidence of ischemia, LVEF 42%.  Echocardiogram showed mildly reduced left ventricular systolic function, LVEF 25-63%, mid and basal inferior wall akinesis, and mild mitral regurgitation in 2018.  Since I last saw him he has done well. The patient denies any new symptoms such as chest discomfort, neck or arm discomfort. There has been no new shortness of breath, PND or orthopnea. There have been no reported palpitations, presyncope or syncope.  He is active in his job at [employer] and [employer].  He does report heartburn.  He says this is unlike angina.  It is associated with food and he feels of water brash at night in bed.  Past Medical History:  Diagnosis Date  . CAD (coronary artery disease), native coronary artery 03/2011   NSTEMI of Cx   . COPD (chronic obstructive pulmonary disease) ([place])   . Coronary artery disease 07/2010   NSTEMI of RCA inferior  . GERD (gastroesophageal reflux disease)   . Hypertension   . MI (myocardial infarction) [facility])     Past Surgical History:  Procedure Laterality Date  . CARDIAC CATHETERIZATION  03/21/2011   Single vessel occlusive atheroscleotic CAD.  The mid CX is occluded which is a new finding compared to January of 2012. Continued patency fo the stent in the RCA. Moderate LV dysfx with EF of 40-45%.  . CAROTID STENT      . COLONOSCOPY N/A 12/29/2017   Procedure: COLONOSCOPY;  Surgeon: Rogene Houston, MD;  [facility];  Service: Endoscopy;  Laterality: N/A;  7:30  . CORONARY ANGIOPLASTY  07/2010   BMS to RCA   . Kidney operation as a child    . POLYPECTOMY  12/29/2017   Procedure: POLYPECTOMY;  Surgeon: Rogene Houston, MD;  [facility];  Service: Endoscopy;;  sigmoid     Current Outpatient Medications  Medication Sig Dispense Refill  . acetaminophen (TYLENOL) 500 MG tablet Take 1,000 mg by mouth every 6 (six) hours as needed. For headache     . albuterol (VENTOLIN HFA) 108 (90 Base) MCG/ACT inhaler Inhale 2 puffs into the lungs every 4 (four) hours as needed for wheezing or shortness of breath. 8 g 2  . aspirin 81 MG tablet Take 81 mg by mouth daily.      Marland Kitchen ibuprofen (ADVIL) 800 MG tablet Take 1 tablet by mouth 3 (three) times daily as needed.    . metoprolol tartrate (LOPRESSOR) 25 MG tablet TAKE 1 TABLET BY MOUTH TWICE A DAY 60 tablet 6  . nitroGLYCERIN (NITROSTAT) 0.4 MG SL tablet PLACE 1 TABLET UNDER THE TONGUE EVERY 5 MINUTES UP TO 3 DOSES 25 tablet 3  . simvastatin (ZOCOR) 40 MG tablet TAKE 1 TABLET BY MOUTH EVERYDAY AT BEDTIME 30 tablet  2  . traMADol (ULTRAM) 50 MG tablet Take 1 tablet by mouth every 6 (six) hours as needed.    Marland Kitchen losartan (COZAAR) 50 MG tablet Take 1 tablet (50 mg total) by mouth daily. 90 tablet 1  . omeprazole (PRILOSEC) 20 MG capsule Take 1 capsule (20 mg total) by mouth daily. 90 capsule 3   No current facility-administered medications for this visit.    Allergies:   Patient has no known allergies.   ROS:  Please see the history of present illness.   Otherwise, review of systems are positive for none.   All other systems are reviewed and negative.    PHYSICAL EXAM: VS:  BP (!) 188/102   Pulse 68   Ht 5\' 9"  (1.753 m)   Wt 195 lb 3.2 oz (88.5 kg)   SpO2 97%   BMI 28.83 kg/m  , BMI Body mass index is 28.83 kg/m. GENERAL:  Well appearing NECK:   No jugular venous distention, waveform within normal limits, carotid upstroke brisk and symmetric, positive left bruits, no thyromegaly LUNGS:  Clear to auscultation bilaterally CHEST:  Unremarkable HEART:  PMI not displaced or sustained,S1 and S2 within normal limits, no S3, no S4, no clicks, no rubs, no murmurs ABD:  Flat, positive bowel sounds normal in frequency in pitch, no bruits, no rebound, no guarding, no midline pulsatile mass, no hepatomegaly, no splenomegaly EXT:  2 plus pulses throughout, no edema, no cyanosis no clubbing   EKG:  EKG is ordered today. The ekg ordered today demonstrates sinus rhythm, rate 68, axis within normal limits, intervals within normal limits, no acute ST-T wave changes.   Recent Labs: No results found for requested labs within last 8760 hours.    Lipid Panel    Component Value Date/Time   CHOL 156 02/25/2020 1220   TRIG 141 02/25/2020 1220   HDL 37 (L) 02/25/2020 1220   CHOLHDL 4.2 02/25/2020 1220   VLDL 28 02/25/2020 1220   LDLCALC 91 02/25/2020 1220      Wt Readings from Last 3 Encounters:  02/25/20 195 lb 3.2 oz (88.5 kg)  02/19/19 198 lb (89.8 kg)  12/29/17 180 lb (81.6 kg)      Other studies Reviewed: Additional studies/ records that were reviewed today include: None. Review of the above records demonstrates:  NA     ASSESSMENT AND PLAN:  Coronary artery disease:    The patient has no new sypmtoms.  No further cardiovascular testing is indicated.  We will continue with aggressive risk reduction and meds as listed.  Hyperlipidemia:I do not see a recent lipid profile and I will have him come back for fasting lipids.  Essential hypertension:His blood pressure is elevated.  He is going to get blood pressure cuff.  For management of this and his heart failure I am going to increase his Cozaar to 50 mg daily.   Bruit: He is going to get carotid Dopplers.  Tobacco abuse: We talked about stopping smoking.  We talked about Chantix.   He will let me know if he wants to get this prescription.  Reflux: He does have symptoms consistent with reflux.  I will give him Prilosec 20 mg daily take for a limited course.  We talked about foods and raising the head of his bed.  Covid education:   He has had his first dose of vaccine.  Current medicines are reviewed at length with the patient today.  The patient does not have concerns regarding medicines.  The following  changes have been made:  As above  Labs/ tests ordered today include:   Orders Placed This Encounter  Procedures  . Lipid panel  . EKG 12-Lead  . VAS US CAROTID     Disposition:   FU with me in one year in [place].  Ronnell Guadalajara, MD  02/27/2020 1:34 PM    [facility]

## 2020-02-25 ENCOUNTER — Ambulatory Visit (INDEPENDENT_AMBULATORY_CARE_PROVIDER_SITE_OTHER): Payer: 59 | Admitting: Cardiology

## 2020-02-25 ENCOUNTER — Other Ambulatory Visit (HOSPITAL_COMMUNITY)
Admission: RE | Admit: 2020-02-25 | Discharge: 2020-02-25 | Disposition: A | Discharge status: Home / Self Care | Payer: 59 | Source: Ambulatory Visit | Attending: Cardiology | Admitting: Cardiology

## 2020-02-25 ENCOUNTER — Encounter: Payer: Self-pay | Admitting: Cardiology

## 2020-02-25 ENCOUNTER — Other Ambulatory Visit: Payer: Self-pay

## 2020-02-25 VITALS — BP 188/102 | HR 68 | Ht 69.0 in | Wt 195.2 lb

## 2020-02-25 DIAGNOSIS — I25118 Atherosclerotic heart disease of native coronary artery with other forms of angina pectoris: Secondary | ICD-10-CM

## 2020-02-25 DIAGNOSIS — I10 Essential (primary) hypertension: Secondary | ICD-10-CM

## 2020-02-25 DIAGNOSIS — E785 Hyperlipidemia, unspecified: Secondary | ICD-10-CM

## 2020-02-25 DIAGNOSIS — Z720 Tobacco use: Secondary | ICD-10-CM | POA: Diagnosis not present

## 2020-02-25 DIAGNOSIS — Z7189 Other specified counseling: Secondary | ICD-10-CM | POA: Diagnosis not present

## 2020-02-25 DIAGNOSIS — R0989 Other specified symptoms and signs involving the circulatory and respiratory systems: Secondary | ICD-10-CM

## 2020-02-25 LAB — LIPID PANEL
Cholesterol: 156 mg/dL (ref 0–200)
HDL: 37 mg/dL — ABNORMAL LOW (ref >40)
LDL Cholesterol: 91 mg/dL (ref 0–99)
Total CHOL/HDL Ratio: 4.2 RATIO
Triglycerides: 141 mg/dL (ref <150)
VLDL: 28 mg/dL (ref 0–40)

## 2020-02-25 MED ORDER — OMEPRAZOLE 20 MG PO CPDR: 20.0000 mg | DELAYED_RELEASE_CAPSULE | Freq: Every day | ORAL | 3 refills | Status: DC

## 2020-02-25 MED ORDER — LOSARTAN POTASSIUM 50 MG PO TABS: 50.0000 mg | ORAL_TABLET | Freq: Every day | ORAL | 1 refills | Status: DC

## 2020-02-25 NOTE — Patient Instructions (Signed)
Your physician wants you to follow-up in: [place] will receive a reminder letter in the mail two months in advance. If you don't receive a letter, please call our office to schedule the follow-up appointment.  Your physician has recommended you make the following change in your medication:   INCREASE LOSARTAN 50 MG DAILY   START PRILOSEC 20 MG DAILY - 1 YEAR SUPPLY GIVEN NO REFILLS   Your physician has requested that you have a carotid duplex. This test is an ultrasound of the carotid arteries in your neck. It looks at blood flow through these arteries that supply the brain with blood. Allow one hour for this exam. There are no restrictions or special instructions.  Your physician recommends that you return for a FASTING lipid profile: PLEASE FAST 6-8 HOURS PRIOR TO LAB WORK   RECOMMENDING OMRON BLOOD PRESSURE CUFF  Thank you for choosing [facility]!!

## 2020-02-27 ENCOUNTER — Encounter: Payer: Self-pay | Admitting: Cardiology

## 2020-02-29 ENCOUNTER — Telehealth: Payer: Self-pay | Admitting: *Deleted

## 2020-02-29 DIAGNOSIS — I25118 Atherosclerotic heart disease of native coronary artery with other forms of angina pectoris: Secondary | ICD-10-CM

## 2020-02-29 MED ORDER — ATORVASTATIN CALCIUM 40 MG PO TABS: 40.0000 mg | ORAL_TABLET | Freq: Every day | ORAL | 1 refills | Status: DC

## 2020-02-29 NOTE — Telephone Encounter (Signed)
Pt voiced understanding - updated medication list and lab orders placed - pt will have repeat labs in 10 weeks at [facility] - routed to pcp

## 2020-02-29 NOTE — Telephone Encounter (Signed)
-----   Message from Merlene Laughter, RN sent at 02/29/2020  7:49 AM EDT -----  ----- Message ----- From: Minus Breeding, MD Sent: 02/27/2020   6:40 PM EDT To: Merlene Laughter, RN  His LDL is not at target.  I would suggest stopping Zocor and changing to Lipitor 40 mg PO daily.  Repeat a Lipid and liver profile in 10 weeks.  Call Mr. Romey with the results and send results to [facility]

## 2020-03-02 ENCOUNTER — Ambulatory Visit (INDEPENDENT_AMBULATORY_CARE_PROVIDER_SITE_OTHER): Payer: 59

## 2020-03-02 ENCOUNTER — Other Ambulatory Visit: Payer: Self-pay

## 2020-03-02 ENCOUNTER — Telehealth: Payer: Self-pay | Admitting: Cardiology

## 2020-03-02 DIAGNOSIS — R0989 Other specified symptoms and signs involving the circulatory and respiratory systems: Secondary | ICD-10-CM

## 2020-03-02 MED ORDER — ALBUTEROL SULFATE HFA 108 (90 BASE) MCG/ACT IN AERS: 2.0000 | INHALATION_SPRAY | RESPIRATORY_TRACT | 0 refills | Status: DC | PRN

## 2020-03-02 NOTE — Telephone Encounter (Signed)
Yes.  I just talked to him and sent his Carotid results to the nursing pool if you want to handle it.  He needs to see [facility] for high grade carotid disease.  Thanks.

## 2020-03-02 NOTE — Addendum Note (Signed)
Addended by: Merlene Laughter on: 03/02/2020 04:29 PM   Modules accepted: Orders

## 2020-03-02 NOTE — Telephone Encounter (Signed)
New message      *STAT* If patient is at the pharmacy, call can be transferred to refill team.   1. Which medications need to be refilled? (please list name of each medication and dose if known) albuterol hfa inhaler  2. Which pharmacy/location (including street and city if local pharmacy) is medication to be sent to?[facility] [place]  3. Do they need a 30 day or 90 day supply? [place]

## 2020-03-02 NOTE — Telephone Encounter (Signed)
Refill for albuterol HFA request should go to PCP

## 2020-03-02 NOTE — Telephone Encounter (Signed)
Patient informed and says he is scheduled to be seen by a PCP but hasn't seen them yet.  Requesting one refill be sent Advised that message would be sent to his provider

## 2020-03-03 ENCOUNTER — Telehealth: Payer: Self-pay | Admitting: *Deleted

## 2020-03-03 DIAGNOSIS — R0989 Other specified symptoms and signs involving the circulatory and respiratory systems: Secondary | ICD-10-CM

## 2020-03-03 NOTE — Telephone Encounter (Signed)
Referral placed.

## 2020-03-03 NOTE — Telephone Encounter (Signed)
-----   Message from Minus Breeding, MD sent at 03/02/2020  4:03 PM EDT ----- I called the patient with his results.  He needs to have a new patient consult with [facility] for carotid stenosis.  Send results to [facility] [employer]

## 2020-03-08 ENCOUNTER — Telehealth: Payer: Self-pay | Admitting: *Deleted

## 2020-03-08 NOTE — Telephone Encounter (Signed)
Contacted [facility], to check status of referral. Sp w/Christy who says she will give patient a call to set up carotid and MD visit.

## 2020-03-08 NOTE — Telephone Encounter (Signed)
-----   Message from Minus Breeding, MD sent at 03/08/2020  8:10 AM EDT ----- Hi,  Can we get this patient an appt with [facility].  I called him and told him to expect a call.  Thanks.  ----- Message ----- From: Interface, [employer] Sent: 03/02/2020   2:09 PM EDT To: Minus Breeding, MD

## 2020-03-12 ENCOUNTER — Other Ambulatory Visit: Payer: Self-pay | Admitting: Cardiology

## 2020-03-13 ENCOUNTER — Other Ambulatory Visit: Payer: Self-pay | Admitting: *Deleted

## 2020-03-13 MED ORDER — NITROGLYCERIN 0.4 MG SL SUBL: SUBLINGUAL_TABLET | SUBLINGUAL | 3 refills | Status: DC

## 2020-03-20 ENCOUNTER — Other Ambulatory Visit: Payer: Self-pay

## 2020-03-20 ENCOUNTER — Ambulatory Visit (INDEPENDENT_AMBULATORY_CARE_PROVIDER_SITE_OTHER): Payer: 59 | Admitting: Vascular Surgery

## 2020-03-20 ENCOUNTER — Encounter: Payer: Self-pay | Admitting: Vascular Surgery

## 2020-03-20 VITALS — BP 202/80 | HR 64 | Temp 98.4°F | Resp 16 | Ht 68.0 in | Wt 194.0 lb

## 2020-03-20 DIAGNOSIS — I6522 Occlusion and stenosis of left carotid artery: Secondary | ICD-10-CM

## 2020-03-20 NOTE — H&P (View-Only) (Signed)
Vascular and Vein Specialist of [place]  Patient name: Juan Martinez MRN: [id number] DOB: 02/01/1956 Sex: male  REASON FOR CONSULT: Evaluation severe left internal carotid artery stenosis  HPI: Juan Martinez is a 64 y.o. male, who is here today for discussion of recent duplex.  He is here today with his daughter.  He denies any prior neurologic deficits.  No history of amaurosis fugax, aphasia, TIA or stroke.  He was found to have a carotid bruit and underwent duplex on 03/02/2020.  This revealed critical left internal carotid artery stenosis at the bifurcation with normal internal carotid distal.  Right is 40 to 59% stenosis.  He does have prior history of coronary artery disease with prior coronary stenting in 2012.  He has remained stable from this standpoint.  Past Medical History:  Diagnosis Date  . CAD (coronary artery disease), native coronary artery 03/2011   NSTEMI of Cx   . COPD (chronic obstructive pulmonary disease) ([place])   . Coronary artery disease 07/2010   NSTEMI of RCA inferior  . GERD (gastroesophageal reflux disease)   . Hypertension   . MI (myocardial infarction) ([place])     Family History  Problem Relation Age of Onset  . Heart disease Mother   . Colon cancer Mother     SOCIAL HISTORY: Social History   Socioeconomic History  . Marital status: Single    Spouse name: Not on file  . Number of children: Not on file  . Years of education: Not on file  . Highest education level: Not on file  Occupational History  . Not on file  Tobacco Use  . Smoking status: Current Every Day Smoker    Packs/day: 1.50    Years: 52.00    Pack years: 78.00    Types: Cigarettes  . Smokeless tobacco: Never Used  Vaping Use  . Vaping Use: Never used  Substance and Sexual Activity  . Alcohol use: No  . Drug use: No  . Sexual activity: Yes  Other Topics Concern  . Not on file  Social History Narrative  . Not on file   Social  Determinants of Health   Financial Resource Strain:   . Difficulty of Paying Living Expenses: Not on file  Food Insecurity:   . Worried About Charity fundraiser in the Last Year: Not on file  . Ran Out of Food in the Last Year: Not on file  Transportation Needs:   . Lack of Transportation (Medical): Not on file  . Lack of Transportation (Non-Medical): Not on file  Physical Activity:   . Days of Exercise per Week: Not on file  . Minutes of Exercise per Session: Not on file  Stress:   . Feeling of Stress : Not on file  Social Connections:   . Frequency of Communication with Friends and Family: Not on file  . Frequency of Social Gatherings with Friends and Family: Not on file  . Attends Religious Services: Not on file  . Active Member of Clubs or Organizations: Not on file  . Attends Archivist Meetings: Not on file  . Marital Status: Not on file  Intimate Partner Violence:   . Fear of Current or Ex-Partner: Not on file  . Emotionally Abused: Not on file  . Physically Abused: Not on file  . Sexually Abused: Not on file    No Known Allergies  Current Outpatient Medications  Medication Sig Dispense Refill  . acetaminophen (TYLENOL) 500 MG tablet Take  1,000 mg by mouth every 6 (six) hours as needed. For headache     . albuterol (VENTOLIN HFA) 108 (90 Base) MCG/ACT inhaler Inhale 2 puffs into the lungs every 4 (four) hours as needed for wheezing or shortness of breath. 8 g 0  . aspirin 81 MG tablet Take 81 mg by mouth daily.      Marland Kitchen atorvastatin (LIPITOR) 40 MG tablet Take 1 tablet (40 mg total) by mouth daily. 90 tablet 1  . ibuprofen (ADVIL) 800 MG tablet Take 1 tablet by mouth 3 (three) times daily as needed.    Marland Kitchen losartan (COZAAR) 50 MG tablet Take 1 tablet (50 mg total) by mouth daily. 90 tablet 1  . metoprolol tartrate (LOPRESSOR) 25 MG tablet TAKE 1 TABLET BY MOUTH TWICE A DAY 60 tablet 6  . nitroGLYCERIN (NITROSTAT) 0.4 MG SL tablet PLACE 1 TABLET UNDER THE TONGUE  EVERY 5 MINUTES UP TO 3 DOSES 25 tablet 3  . omeprazole (PRILOSEC) 20 MG capsule Take 1 capsule (20 mg total) by mouth daily. 90 capsule 3  . traMADol (ULTRAM) 50 MG tablet Take 1 tablet by mouth every 6 (six) hours as needed.     No current facility-administered medications for this visit.    REVIEW OF SYSTEMS:  [X]  denotes positive finding, [ ]  denotes negative finding Cardiac  Comments:  Chest pain or chest pressure:    Shortness of breath upon exertion: x   Short of breath when lying flat:    Irregular heart rhythm:        Vascular    Pain in calf, thigh, or hip brought on by ambulation:    Pain in feet at night that wakes you up from your sleep:     Blood clot in your veins:    Leg swelling:         Pulmonary    Oxygen at home:    Productive cough:     Wheezing:  x       Neurologic    Sudden weakness in arms or legs:     Sudden numbness in arms or legs:     Sudden onset of difficulty speaking or slurred speech:    Temporary loss of vision in one eye:     Problems with dizziness:         Gastrointestinal    Blood in stool:     Vomited blood:         Genitourinary    Burning when urinating:     Blood in urine:        Psychiatric    Major depression:         Hematologic    Bleeding problems:    Problems with blood clotting too easily:        Skin    Rashes or ulcers:        Constitutional    Fever or chills:      PHYSICAL EXAM: Vitals:   03/20/20 1040  BP: (!) 202/80  Pulse: 64  Resp: 16  Temp: 98.4 F (36.9 C)  TempSrc: Oral  SpO2: 95%  Weight: 194 lb (88 kg)  Height: 5\' 8"  (1.727 m)    GENERAL: The patient is a well-nourished male, in no acute distress. The vital signs are documented above. CARDIOVASCULAR: Soft left carotid bruit, no bruit on the right.  2+ radial and 2+ dorsalis pedis pulses bilaterally PULMONARY: There is good air exchange  ABDOMEN: Soft and non-tender  MUSCULOSKELETAL: There are no  major deformities or  cyanosis. NEUROLOGIC: No focal weakness or paresthesias are detected. SKIN: There are no ulcers or rashes noted. PSYCHIATRIC: The patient has a normal affect.  DATA:  Duplex reviewed revealing critical stenosis left internal carotid artery  MEDICAL ISSUES: Had a long discussion with the patient and his daughter.  I have recommended left endarterectomy for reduction of stroke risk.  I explained his expected risk for neurologic deficit approximately 5 %/year with a high-grade asymptomatic carotid.  I did discuss risk of carotid surgery to include a 1 to 1-1/2% risk of stroke with surgery and also with slight risk of cranial nerve injury.  I explained to 1 night hospitalization after surgery with expected discharge home on postop day 1 and being able to resume full activities at 2 weeks postop.  He wishes to schedule as soon as possible.  We will assure that he is stable from cardiac standpoint for surgery   Rosetta Posner, MD [facility] Vascular and Vein Specialists of [facility] Tel [phone] Pager [phone]

## 2020-03-20 NOTE — Progress Notes (Signed)
Vascular and Vein Specialist of [place]  Patient name: Juan Martinez MRN: [id number] DOB: 11-22-1955 Sex: male  REASON FOR CONSULT: Evaluation severe left internal carotid artery stenosis  HPI: Juan Martinez is a 64 y.o. male, who is here today for discussion of recent duplex.  He is here today with his daughter.  He denies any prior neurologic deficits.  No history of amaurosis fugax, aphasia, TIA or stroke.  He was found to have a carotid bruit and underwent duplex on 03/02/2020.  This revealed critical left internal carotid artery stenosis at the bifurcation with normal internal carotid distal.  Right is 40 to 59% stenosis.  He does have prior history of coronary artery disease with prior coronary stenting in 2012.  He has remained stable from this standpoint.  Past Medical History:  Diagnosis Date  . CAD (coronary artery disease), native coronary artery 03/2011   NSTEMI of Cx   . COPD (chronic obstructive pulmonary disease) ([place])   . Coronary artery disease 07/2010   NSTEMI of RCA inferior  . GERD (gastroesophageal reflux disease)   . Hypertension   . MI (myocardial infarction) ([place])     Family History  Problem Relation Age of Onset  . Heart disease Mother   . Colon cancer Mother     SOCIAL HISTORY: Social History   Socioeconomic History  . Marital status: Single    Spouse name: Not on file  . Number of children: Not on file  . Years of education: Not on file  . Highest education level: Not on file  Occupational History  . Not on file  Tobacco Use  . Smoking status: Current Every Day Smoker    Packs/day: 1.50    Years: 52.00    Pack years: 78.00    Types: Cigarettes  . Smokeless tobacco: Never Used  Vaping Use  . Vaping Use: Never used  Substance and Sexual Activity  . Alcohol use: No  . Drug use: No  . Sexual activity: Yes  Other Topics Concern  . Not on file  Social History Narrative  . Not on file   Social  Determinants of Health   Financial Resource Strain:   . Difficulty of Paying Living Expenses: Not on file  Food Insecurity:   . Worried About Charity fundraiser in the Last Year: Not on file  . Ran Out of Food in the Last Year: Not on file  Transportation Needs:   . Lack of Transportation (Medical): Not on file  . Lack of Transportation (Non-Medical): Not on file  Physical Activity:   . Days of Exercise per Week: Not on file  . Minutes of Exercise per Session: Not on file  Stress:   . Feeling of Stress : Not on file  Social Connections:   . Frequency of Communication with Friends and Family: Not on file  . Frequency of Social Gatherings with Friends and Family: Not on file  . Attends Religious Services: Not on file  . Active Member of Clubs or Organizations: Not on file  . Attends Archivist Meetings: Not on file  . Marital Status: Not on file  Intimate Partner Violence:   . Fear of Current or Ex-Partner: Not on file  . Emotionally Abused: Not on file  . Physically Abused: Not on file  . Sexually Abused: Not on file    No Known Allergies  Current Outpatient Medications  Medication Sig Dispense Refill  . acetaminophen (TYLENOL) 500 MG tablet Take  1,000 mg by mouth every 6 (six) hours as needed. For headache     . albuterol (VENTOLIN HFA) 108 (90 Base) MCG/ACT inhaler Inhale 2 puffs into the lungs every 4 (four) hours as needed for wheezing or shortness of breath. 8 g 0  . aspirin 81 MG tablet Take 81 mg by mouth daily.      Marland Kitchen atorvastatin (LIPITOR) 40 MG tablet Take 1 tablet (40 mg total) by mouth daily. 90 tablet 1  . ibuprofen (ADVIL) 800 MG tablet Take 1 tablet by mouth 3 (three) times daily as needed.    Marland Kitchen losartan (COZAAR) 50 MG tablet Take 1 tablet (50 mg total) by mouth daily. 90 tablet 1  . metoprolol tartrate (LOPRESSOR) 25 MG tablet TAKE 1 TABLET BY MOUTH TWICE A DAY 60 tablet 6  . nitroGLYCERIN (NITROSTAT) 0.4 MG SL tablet PLACE 1 TABLET UNDER THE TONGUE  EVERY 5 MINUTES UP TO 3 DOSES 25 tablet 3  . omeprazole (PRILOSEC) 20 MG capsule Take 1 capsule (20 mg total) by mouth daily. 90 capsule 3  . traMADol (ULTRAM) 50 MG tablet Take 1 tablet by mouth every 6 (six) hours as needed.     No current facility-administered medications for this visit.    REVIEW OF SYSTEMS:  [X]  denotes positive finding, [ ]  denotes negative finding Cardiac  Comments:  Chest pain or chest pressure:    Shortness of breath upon exertion: x   Short of breath when lying flat:    Irregular heart rhythm:        Vascular    Pain in calf, thigh, or hip brought on by ambulation:    Pain in feet at night that wakes you up from your sleep:     Blood clot in your veins:    Leg swelling:         Pulmonary    Oxygen at home:    Productive cough:     Wheezing:  x       Neurologic    Sudden weakness in arms or legs:     Sudden numbness in arms or legs:     Sudden onset of difficulty speaking or slurred speech:    Temporary loss of vision in one eye:     Problems with dizziness:         Gastrointestinal    Blood in stool:     Vomited blood:         Genitourinary    Burning when urinating:     Blood in urine:        Psychiatric    Major depression:         Hematologic    Bleeding problems:    Problems with blood clotting too easily:        Skin    Rashes or ulcers:        Constitutional    Fever or chills:      PHYSICAL EXAM: Vitals:   03/20/20 1040  BP: (!) 202/80  Pulse: 64  Resp: 16  Temp: 98.4 F (36.9 C)  TempSrc: Oral  SpO2: 95%  Weight: 194 lb (88 kg)  Height: 5\' 8"  (1.727 m)    GENERAL: The patient is a well-nourished male, in no acute distress. The vital signs are documented above. CARDIOVASCULAR: Soft left carotid bruit, no bruit on the right.  2+ radial and 2+ dorsalis pedis pulses bilaterally PULMONARY: There is good air exchange  ABDOMEN: Soft and non-tender  MUSCULOSKELETAL: There are no  major deformities or  cyanosis. NEUROLOGIC: No focal weakness or paresthesias are detected. SKIN: There are no ulcers or rashes noted. PSYCHIATRIC: The patient has a normal affect.  DATA:  Duplex reviewed revealing critical stenosis left internal carotid artery  MEDICAL ISSUES: Had a long discussion with the patient and his daughter.  I have recommended left endarterectomy for reduction of stroke risk.  I explained his expected risk for neurologic deficit approximately 5 %/year with a high-grade asymptomatic carotid.  I did discuss risk of carotid surgery to include a 1 to 1-1/2% risk of stroke with surgery and also with slight risk of cranial nerve injury.  I explained to 1 night hospitalization after surgery with expected discharge home on postop day 1 and being able to resume full activities at 2 weeks postop.  He wishes to schedule as soon as possible.  We will assure that he is stable from cardiac standpoint for surgery   Rosetta Posner, MD [facility] Vascular and Vein Specialists of [facility] Tel [phone] Pager [phone]

## 2020-03-23 ENCOUNTER — Other Ambulatory Visit: Payer: Self-pay

## 2020-03-28 ENCOUNTER — Other Ambulatory Visit (HOSPITAL_COMMUNITY): Payer: 59

## 2020-04-06 NOTE — Pre-Procedure Instructions (Addendum)
Juan Martinez  04/06/2020    Your procedure is scheduled on Friday, April 14, 2020 at 7:30 AM.   Report to [facility] Entrance "A" Admitting Office at 5:30 AM.   Call this number if you have problems the morning of surgery: [phone]   Questions prior to day of surgery, please call [phone] between 8 & 4 PM.   Remember:  Do not eat or drink after midnight Thursday, 04/13/20.  Take these medicines the morning of surgery with A SIP OF WATER: Aspirin, Atorvastatin (Lipitor), Metoprolol (Lopressor), Omeprazole (Prilosec), Nitroglycerin - if needed, Acetaminophen (Tylenol) - if needed, Albuterol inhaler - if needed (bring inhaler with you day of surgery.  Do not use NSAIDS (Ibuprofen, Aleve, etc), other Aspirin containing products, Multivitamins, Herbal medications or Fish oil prior to surgery.  No smoking 24 hours prior to surgery.    Do not wear jewelry.  Do not wear lotions, powders, cologne or deodorant.  Men may shave face and neck.  Do not bring valuables to the hospital.  [facility] is not responsible for any belongings or valuables.  Contacts, dentures or bridgework may not be worn into surgery.  Leave your suitcase in the car.  After surgery it may be brought to your room.  For patients admitted to the hospital, discharge time will be determined by your treatment team.  [facility] - Preparing for Surgery  Before surgery, you can play an important role.  Because skin is not sterile, your skin needs to be as free of germs as possible.  You can reduce the number of germs on you skin by washing with CHG (chlorahexidine gluconate) soap before surgery.  CHG is an antiseptic cleaner which kills germs and bonds with the skin to continue killing germs even after washing.  Oral Hygiene is also important in reducing the risk of infection.  Remember to brush your teeth with your regular toothpaste the morning of surgery.  Please DO NOT use if you have an allergy to CHG  or antibacterial soaps.  If your skin becomes reddened/irritated stop using the CHG and inform your nurse when you arrive at Short Stay.  Do not shave (including legs and underarms) for at least 48 hours prior to the first CHG shower.  You may shave your face.  Please follow these instructions carefully:   1.  Shower with CHG Soap the night before surgery and the morning of Surgery.  2.  If you choose to wash your hair, wash your hair first as usual with your normal shampoo.  3.  After you shampoo, rinse your hair and body thoroughly to remove the shampoo. 4.  Use CHG as you would any other liquid soap.  You can apply chg directly to the skin and wash gently with a      scrungie or washcloth.           5.  Apply the CHG Soap to your body ONLY FROM THE NECK DOWN.   Do not use on open wounds or open sores. Avoid contact with your eyes, ears, mouth and genitals (private parts).  Wash genitals (private parts) with your normal soap - do this prior to using the CHG soap.  6.  Wash thoroughly, paying special attention to the area where your surgery will be performed.  7.  Thoroughly rinse your body with warm water from the neck down.  8.  DO NOT shower/wash with your normal soap after using and rinsing off the CHG Soap.  9.  Pat yourself dry with a clean towel.            10.  Wear clean pajamas.            11.  Place clean sheets on your bed the night of your first shower and do not sleep with pets.  Day of Surgery  Shower as above. Do not apply any lotions/deoderants the morning of surgery.   Please wear clean clothes to the hospital/surgery center. Remember to brush your teeth with toothpaste.  Please read over the fact sheets that you were given.

## 2020-04-07 ENCOUNTER — Encounter (HOSPITAL_COMMUNITY)
Admission: RE | Admit: 2020-04-07 | Discharge: 2020-04-07 | Disposition: A | Discharge status: Home / Self Care | Payer: 59 | Source: Ambulatory Visit | Attending: Vascular Surgery | Admitting: Vascular Surgery

## 2020-04-07 ENCOUNTER — Other Ambulatory Visit: Payer: Self-pay

## 2020-04-07 ENCOUNTER — Encounter (HOSPITAL_COMMUNITY): Payer: Self-pay

## 2020-04-07 DIAGNOSIS — Z01812 Encounter for preprocedural laboratory examination: Secondary | ICD-10-CM | POA: Diagnosis present

## 2020-04-07 HISTORY — DX: Personal history of urinary calculi: Z87.442

## 2020-04-07 HISTORY — DX: Pneumonia, unspecified organism: J18.9

## 2020-04-07 LAB — URINALYSIS, ROUTINE W REFLEX MICROSCOPIC
Bilirubin Urine: NEGATIVE
Glucose, UA: NEGATIVE mg/dL
Hgb urine dipstick: NEGATIVE
Ketones, ur: NEGATIVE mg/dL
Leukocytes,Ua: NEGATIVE
Nitrite: NEGATIVE
Protein, ur: NEGATIVE mg/dL
Specific Gravity, Urine: 1.005 (ref 1.005–1.030)
pH: 7 (ref 5.0–8.0)

## 2020-04-07 LAB — COMPREHENSIVE METABOLIC PANEL
ALT: 16 U/L (ref 0–44)
AST: 16 U/L (ref 15–41)
Albumin: 3.8 g/dL (ref 3.5–5.0)
Alkaline Phosphatase: 77 U/L (ref 38–126)
Anion gap: 10 (ref 5–15)
BUN: 17 mg/dL (ref 8–23)
CO2: 24 mmol/L (ref 22–32)
Calcium: 9.3 mg/dL (ref 8.9–10.3)
Chloride: 104 mmol/L (ref 98–111)
Creatinine, Ser: 0.98 mg/dL (ref 0.61–1.24)
GFR calc non Af Amer: >60 mL/min (ref >60)
Glucose, Bld: 95 mg/dL (ref 70–99)
Potassium: 4.1 mmol/L (ref 3.5–5.1)
Sodium: 138 mmol/L (ref 135–145)
Total Bilirubin: 0.4 mg/dL (ref 0.3–1.2)
Total Protein: 7.6 g/dL (ref 6.5–8.1)

## 2020-04-07 LAB — CBC
HCT: 42.7 % (ref 39.0–52.0)
Hemoglobin: 14 g/dL (ref 13.0–17.0)
MCH: 30.8 pg (ref 26.0–34.0)
MCHC: 32.8 g/dL (ref 30.0–36.0)
MCV: 94.1 fL (ref 80.0–100.0)
Platelets: 143 10*3/uL — ABNORMAL LOW (ref 150–400)
RBC: 4.54 MIL/uL (ref 4.22–5.81)
RDW: 13.6 % (ref 11.5–15.5)
WBC: 9.5 10*3/uL (ref 4.0–10.5)
nRBC: 0 % (ref 0.0–0.2)

## 2020-04-07 LAB — SURGICAL PCR SCREEN
MRSA, PCR: NEGATIVE
Staphylococcus aureus: NEGATIVE

## 2020-04-07 LAB — TYPE AND SCREEN
ABO/RH(D): A POS
Antibody Screen: NEGATIVE

## 2020-04-07 LAB — APTT: aPTT: 28 seconds (ref 24–36)

## 2020-04-07 LAB — PROTIME-INR
INR: 1.1 (ref 0.8–1.2)
Prothrombin Time: 13.2 seconds (ref 11.4–15.2)

## 2020-04-07 NOTE — Progress Notes (Addendum)
PCP - none Cardiologist - Dr. Percival Spanish  EKG - 02/25/20 Stress Test - 02/03/17 ECHO - 02/03/17 Cardiac Cath - 2012  Aspirin Instructions:  To continue Aspirin   COVID TEST- Scheduled for 04/13/20   Anesthesia review: yes, heart hx, Hypertension. Pt states his blood pressure has been running high for a good while. Cozaar was increased at his appt with Dr. Percival Spanish on 02/25/20. Pt states he is taking is medications  Patient denies shortness of breath, fever, cough and chest pain at PAT appointment   All instructions explained to the patient, with a verbal understanding of the material. Patient agrees to go over the instructions while at home for a better understanding. Patient also instructed to self quarantine after being tested for COVID-19. The opportunity to ask questions was provided.

## 2020-04-10 ENCOUNTER — Telehealth: Payer: Self-pay | Admitting: Cardiology

## 2020-04-10 MED ORDER — LOSARTAN POTASSIUM 100 MG PO TABS: 100.0000 mg | ORAL_TABLET | Freq: Every day | ORAL | 3 refills | Status: DC

## 2020-04-10 NOTE — Telephone Encounter (Signed)
Please increase losartan to 100mg  daily (noted recent BMET done and all results WNL)

## 2020-04-10 NOTE — Telephone Encounter (Signed)
Routed to Pharm D pool for review.

## 2020-04-10 NOTE — Progress Notes (Addendum)
Anesthesia Chart Review:  Case: [id number] Date/Time: 04/14/20 0715   Procedure: ENDARTERECTOMY CAROTID (Left )   Anesthesia type: General   Pre-op diagnosis: LEFT CAROTID ARTERY STENOSIS   Location: [facility] ROOM 11 / [facility]   Surgeons: Rosetta Posner, MD      DISCUSSION: Patient is a 64 year old male scheduled for the above procedure.  Cardiologist Dr. Percival Spanish referred patient to the [facility] after recent carotid ultrasound (ordered for bruit evaluation) showed severe left ICA stenosis.  History includes smoking, COPD, CAD (inferior NSTEMI s/p BMS of occluded RCA 07/13/10, late presentation STEMI with occluded mid CX with patent RCA stent, medical therapy 03/21/11, HTN, GERD, right intraarticular calcaneus fracture (s/p ORIF, subtalar joint arthrodesis, bone graft harvest iliac crest 03/04/19)  BP elevated at 194/93 and 201/98 at PAT. Cozaar ws increased to 50 mg at 02/25/20 cardiology appointment with Dr. 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  No further cardiovascular testing felt indicated at that time. One year follow-up planned in [employer] office.   Current BP medications:  Losartan 50 mg daily (increased 02/25/20) Lopressor 25 mg BID - I called and spoke with Juan Martinez. He confirms that he is taking his BP medications (and had taken them the morning of his PAT visit). He denied headaches, chest pain, SOB. He said he does have a home BP cuff, and took his BP at least once last week but he can't remember the reading (he is out of town and BP reading is written down at his house) but says SBP was < 200, but otherwise can't provide details. He says he did smoke a cigarette prior to PAT and routinely drinks a pot of regular coffee (4-6 cups) each morning when he is at home. He denied other stimulants and denied pain. I told him that I would reach out to Dr. Percival Spanish and Dr. Donnetta Hutching (spoke with their staff on 04/10/20 PM) about his BP readings and ask them  to get in touch with him for any additional recommendations. He does have severe LICA stenosis, asymptomatic at this time.  If he arrives for surgery with significantly elevated BP then urgency of procedure could be further addressed with surgeon with discussion about possible treatment strategies.   He is to continue aspirin for surgery per [facility].  Presurgical COVID-19 test is scheduled for 04/13/2020.    ADDENDUM 04/11/20 10:21 AM:  Dr. Donnetta Hutching is aware of elevated BP. He is hoping cardiology with adjust BP medications so ideally SBP < 200 before surgery. At this point, he is planning to keep case as scheduled and will make further determination on the day of surgery based on BP readings. In the interim, cardiology did increase losartan from 50 mg to 100 mg daily (to start 04/11/20) and advised close home BP monitoring.    [facility]: BP (!) 201/98   Pulse 63   Temp 36.5 C (Oral)   Resp 17   Ht 5\' 8"  (1.727 m)   Wt 89 kg   SpO2 99%   BMI 29.82 kg/m   BP 194/93, 201/98, HR 63 BP Readings from Last 3 Encounters:  04/07/20 (!) 201/98  03/20/20 (!) 202/80  02/25/20 (!) 188/102    PROVIDERS: [facility] [place] is where he receives primary care Juan Breeding, MD is cardiologist. Previously he was followed by Kate Sable, MD in [place], but will now be seeing Dr. Percival Spanish in [place].    LABS: Labs reviewed: Acceptable for  surgery. (all labs ordered are listed, but only abnormal results are displayed)  Labs Reviewed  CBC - Abnormal; Notable for the following components:      Result Value   Platelets 143 (*)    All other components within normal limits  URINALYSIS, ROUTINE W REFLEX MICROSCOPIC - Abnormal; Notable for the following components:   Color, Urine STRAW (*)    All other components within normal limits  SURGICAL PCR SCREEN  APTT  COMPREHENSIVE METABOLIC PANEL  PROTIME-INR  TYPE AND SCREEN    EKG: 02/25/20:  Normal sinus rhythm Possible inferior infarct, age  undetermined   CV: Carotid US 03/02/20: Summary:  - Right Carotid: Velocities in the right ICA are consistent with a 40-59% stenosis. The ECA appears >50% stenosed.  - Left Carotid: Velocities in the left ICA are consistent with a 80-99% stenosis.  - Vertebrals: Bilateral vertebral arteries demonstrate antegrade flow.  - Subclavians: Normal flow hemodynamics were seen in bilateral subclavian arteries.    Nuclear stress test 02/03/17:  Findings consistent with prior myocardial infarction.  This is an intermediate risk study.  The left ventricular ejection fraction is moderately decreased (30-44%).  There was no ST segment deviation noted during stress. Large inferior wall infarct from apex to base no ischemia EF 42%   Echo 02/03/17: Study Conclusions  - Left ventricle: Mid and basal inferior wall akinesis The cavity  size was mildly dilated. Systolic function was mildly reduced.  The estimated ejection fraction was in the range of 45% to 50%.  Wall motion was normal; there were no regional wall motion  abnormalities. Left ventricular diastolic function parameters  were normal.  - Mitral valve: Calcified annulus. Mildly thickened leaflets .  There was mild regurgitation.  - Atrial septum: No defect or patent foramen ovale was identified.  ([facility]: LHC 03/21/11: LVEF 40-45%, mid to basal inferior wall akinesis; Echo 08/06/10: LVEF 60%, mild hypokinesis base of inferior wall, mild MR; LHC 07/13/10: LVEF ~ 45-50%, basal to mid inferior wall was akinetic, 2-3+ MR)   Cardiac cath 03/21/11: - LM: Without significant disease - LAD: Mild calcification of the proximal vessel.  Minimal wall irregularities in the LAD. D1 40 to 50% disease proximally. - LCX: Gives rise to 2 marginal branches.  It is occluded in the mid vessel after OM1 branch.  Mild ostial narrowing of the LCX.  No collaterals to the marginal branch. - RCA: Dominant vessel.  Proximal RCA stent widely patent.  Focal  40 to 50% stenosis in the mid vessel at the takeoff of the RV marginal 1 branch. - LV: Inferior wall akinesis involving the mid to basal segment.  Overall EF 40 to 45%. FINAL INTERPRETATION: 1. Single-vessel occlusive atherosclerotic coronary artery disease.     The mid circumflex is occluded which is a new finding compared to     January of this year. 2. Continued patency of the stent in the right coronary artery. 3. Moderate left ventricular dysfunction. PLAN:   The patient is currently more than 11 hours out from the onset of his chest pain.  He already has positive enzymes.  He is currently pain free.  I think there is no additional benefit to reperfusion of the left circumflex coronary artery at this point.  We will treat the patient Medically.   Past Medical History:  Diagnosis Date  . CAD (coronary artery disease), native coronary artery 03/2011   NSTEMI of Cx   . COPD (chronic obstructive pulmonary disease) ([place])   . Coronary  artery disease 07/2010   NSTEMI of RCA inferior  . GERD (gastroesophageal reflux disease)   . History of kidney stones   . Hypertension   . MI (myocardial infarction) ([place])   . Pneumonia    walking pneumonia    Past Surgical History:  Procedure Laterality Date  . ANKLE FRACTURE SURGERY Right   . CARDIAC CATHETERIZATION  03/21/2011   Single vessel occlusive atheroscleotic CAD.  The mid CX is occluded which is a new finding compared to January of 2012. Continued patency fo the stent in the RCA. Moderate LV dysfx with EF of 40-45%.  . CAROTID STENT     pt denies, pt has no scar on either side of his neck  . COLONOSCOPY N/A 12/29/2017   Procedure: COLONOSCOPY;  Surgeon: Rogene Houston, MD;  Location: [facility];  Service: Endoscopy;  Laterality: N/A;  7:30  . CORONARY ANGIOPLASTY  07/2010   BMS to RCA   . Kidney operation as a child    . POLYPECTOMY  12/29/2017   Procedure: POLYPECTOMY;  Surgeon: Rogene Houston, MD;  Location: [facility];   Service: Endoscopy;;  sigmoid    MEDICATIONS: . acetaminophen (TYLENOL) 500 MG tablet  . albuterol (VENTOLIN HFA) 108 (90 Base) MCG/ACT inhaler  . aspirin 81 MG tablet  . atorvastatin (LIPITOR) 40 MG tablet  . ibuprofen (ADVIL) 800 MG tablet  . losartan (COZAAR) 50 MG tablet  . metoprolol tartrate (LOPRESSOR) 25 MG tablet  . nitroGLYCERIN (NITROSTAT) 0.4 MG SL tablet  . omeprazole (PRILOSEC) 20 MG capsule  . traMADol (ULTRAM) 50 MG tablet   No current facility-administered medications for this encounter.    Myra Gianotti, PA-C Surgical Short Stay/Anesthesiology [facility] Phone [phone] [facility] Phone [phone] 04/10/2020 5:03 PM

## 2020-04-10 NOTE — Addendum Note (Signed)
Addended by: Leotis Pain, [place] on: 04/10/2020 04:20 PM   Modules accepted: Orders

## 2020-04-10 NOTE — Anesthesia Preprocedure Evaluation (Addendum)
Anesthesia Evaluation  Patient identified by MRN, date of birth, ID band Patient awake    Reviewed: Allergy & Precautions, NPO status , Patient's Chart, lab work & pertinent test results  Airway Mallampati: II  TM Distance: >3 FB Neck ROM: Full    Dental  (+) Dental Advisory Given   Pulmonary COPD, Current Smoker and Patient abstained from smoking.,    breath sounds clear to auscultation       Cardiovascular hypertension, Pt. on medications and Pt. on home beta blockers + CAD, + Past MI, + Cardiac Stents, + Peripheral Vascular Disease and +CHF   Rhythm:Regular Rate:Normal     Neuro/Psych negative neurological ROS     GI/Hepatic Neg liver ROS, GERD  ,  Endo/Other  negative endocrine ROS  Renal/GU negative Renal ROS     Musculoskeletal   Abdominal   Peds  Hematology negative hematology ROS (+)   Anesthesia Other Findings   Reproductive/Obstetrics                           Lab Results  Component Value Date   WBC 9.5 04/07/2020   HGB 14.0 04/07/2020   HCT 42.7 04/07/2020   MCV 94.1 04/07/2020   PLT 143 (L) 04/07/2020   Lab Results  Component Value Date   CREATININE 0.98 04/07/2020   BUN 17 04/07/2020   NA 138 04/07/2020   K 4.1 04/07/2020   CL 104 04/07/2020   CO2 24 04/07/2020   Carotid US 03/02/20: Summary:  - Right Carotid: Velocities in the right ICA are consistent with a 40-59% stenosis. The ECA appears >50% stenosed.  - Left Carotid: Velocities in the left ICA are consistent with a 80-99% stenosis.  - Vertebrals: Bilateral vertebral arteries demonstrate antegrade flow.  - Subclavians: Normal flow hemodynamics were seen in bilateral subclavian arteries.    Nuclear stress test 02/03/17:  Findings consistent with prior myocardial infarction.  This is an intermediate risk study.  The left ventricular ejection fraction is moderately decreased (30-44%).  There was no ST  segment deviation noted during stress. Large inferior wall infarct from apex to base no ischemia EF 42%   Echo 02/03/17: Study Conclusions  - Left ventricle: Mid and basal inferior wall akinesis The cavity  size was mildly dilated. Systolic function was mildly reduced.  The estimated ejection fraction was in the range of 45% to 50%.  Wall motion was normal; there were no regional wall motion  abnormalities. Left ventricular diastolic function parameters  were normal.  - Mitral valve: Calcified annulus. Mildly thickened leaflets .  There was mild regurgitation.  - Atrial septum: No defect or patent foramen ovale was identified.  ([facility]: LHC 03/21/11: LVEF 40-45%, mid to basal inferior wall akinesis; Echo 08/06/10: LVEF 60%, mild hypokinesis base of inferior wall, mild MR; LHC 07/13/10: LVEF ~ 45-50%, basal to mid inferior wall was akinetic, 2-3+ MR)   Anesthesia Physical Anesthesia Plan  ASA: III  Anesthesia Plan: General   Post-op Pain Management:    Induction: Intravenous  PONV Risk Score and Plan: 1 and Dexamethasone, Ondansetron and Treatment may vary due to age or medical condition  Airway Management Planned: Oral ETT  Additional Equipment: Arterial line  Intra-op Plan:   Post-operative Plan: Extubation in OR  Informed Consent: I have reviewed the patients History and Physical, chart, labs and discussed the procedure including the risks, benefits and alternatives for the proposed anesthesia with the patient or authorized representative who has  indicated his/her understanding and acceptance.     Dental advisory given  Plan Discussed with: CRNA  Anesthesia Plan Comments: (PAT note written by Myra Gianotti, PA-C. )      Anesthesia Quick Evaluation

## 2020-04-10 NOTE — Telephone Encounter (Signed)
Informed pt of increased dose of losartan 100 mg daily, starting with tomorrow's dose. Encouraged pt to start taking blood pressures at home more frequently due to persistent issues with hypertension and frequent medication adjustments. Educated pt on the symptoms of hypotension. Pt instructed to adhere to pre-procedure teaching ahead of carotid endarterectomy this Friday. Pt verbalizes understanding and all questions were answered.

## 2020-04-10 NOTE — Telephone Encounter (Signed)
Pt c/o BP issue: STAT if pt c/o blurred vision, one-sided weakness or slurred speech  1. What are your last 5 BP readings?   04/07/2020 - 194/93 and 201/98 03/20/2020 - 202/80    2. Are you having any other symptoms (ex. Dizziness, headache, blurred vision, passed out)?  no  3. What is your BP issue?  Elevated blood pressure,   For pre-op evaluation, patients having surgery on Friday

## 2020-04-13 ENCOUNTER — Other Ambulatory Visit (HOSPITAL_COMMUNITY)
Admission: RE | Admit: 2020-04-13 | Discharge: 2020-04-13 | Disposition: A | Discharge status: Home / Self Care | Payer: 59 | Source: Ambulatory Visit | Attending: Vascular Surgery | Admitting: Vascular Surgery

## 2020-04-13 DIAGNOSIS — Z01812 Encounter for preprocedural laboratory examination: Secondary | ICD-10-CM | POA: Diagnosis not present

## 2020-04-13 DIAGNOSIS — Z20822 Contact with and (suspected) exposure to covid-19: Secondary | ICD-10-CM | POA: Diagnosis not present

## 2020-04-13 LAB — SARS CORONAVIRUS 2 (TAT 6-24 HRS): SARS Coronavirus 2: NEGATIVE

## 2020-04-14 ENCOUNTER — Inpatient Hospital Stay (HOSPITAL_COMMUNITY): Payer: 59 | Admitting: Anesthesiology

## 2020-04-14 ENCOUNTER — Other Ambulatory Visit: Payer: Self-pay

## 2020-04-14 ENCOUNTER — Observation Stay (HOSPITAL_COMMUNITY)
Admission: RE | Admit: 2020-04-14 | Discharge: 2020-04-15 | Disposition: A | Discharge status: Home / Self Care | Payer: 59 | Attending: Vascular Surgery | Admitting: Vascular Surgery

## 2020-04-14 ENCOUNTER — Encounter (HOSPITAL_COMMUNITY)
Admission: RE | Disposition: A | Discharge status: Home / Self Care | Payer: Self-pay | Source: Home / Self Care | Attending: Vascular Surgery

## 2020-04-14 ENCOUNTER — Encounter (HOSPITAL_COMMUNITY): Payer: Self-pay | Admitting: Vascular Surgery

## 2020-04-14 ENCOUNTER — Inpatient Hospital Stay (HOSPITAL_COMMUNITY): Payer: 59 | Admitting: Vascular Surgery

## 2020-04-14 DIAGNOSIS — I252 Old myocardial infarction: Secondary | ICD-10-CM | POA: Diagnosis not present

## 2020-04-14 DIAGNOSIS — I10 Essential (primary) hypertension: Secondary | ICD-10-CM | POA: Diagnosis not present

## 2020-04-14 DIAGNOSIS — Z87442 Personal history of urinary calculi: Secondary | ICD-10-CM | POA: Diagnosis not present

## 2020-04-14 DIAGNOSIS — F17210 Nicotine dependence, cigarettes, uncomplicated: Secondary | ICD-10-CM | POA: Diagnosis not present

## 2020-04-14 DIAGNOSIS — J449 Chronic obstructive pulmonary disease, unspecified: Secondary | ICD-10-CM | POA: Diagnosis not present

## 2020-04-14 DIAGNOSIS — Z7982 Long term (current) use of aspirin: Secondary | ICD-10-CM | POA: Insufficient documentation

## 2020-04-14 DIAGNOSIS — Z955 Presence of coronary angioplasty implant and graft: Secondary | ICD-10-CM | POA: Diagnosis not present

## 2020-04-14 DIAGNOSIS — Z8249 Family history of ischemic heart disease and other diseases of the circulatory system: Secondary | ICD-10-CM | POA: Insufficient documentation

## 2020-04-14 DIAGNOSIS — I6522 Occlusion and stenosis of left carotid artery: Principal | ICD-10-CM | POA: Diagnosis present

## 2020-04-14 DIAGNOSIS — Z79899 Other long term (current) drug therapy: Secondary | ICD-10-CM | POA: Insufficient documentation

## 2020-04-14 DIAGNOSIS — I2510 Atherosclerotic heart disease of native coronary artery without angina pectoris: Secondary | ICD-10-CM | POA: Insufficient documentation

## 2020-04-14 DIAGNOSIS — K219 Gastro-esophageal reflux disease without esophagitis: Secondary | ICD-10-CM | POA: Insufficient documentation

## 2020-04-14 HISTORY — PX: ENDARTERECTOMY: SHX5162

## 2020-04-14 HISTORY — PX: PATCH ANGIOPLASTY: SHX6230

## 2020-04-14 LAB — ABO/RH: ABO/RH(D): A POS

## 2020-04-14 SURGERY — ENDARTERECTOMY, CAROTID
Anesthesia: General | Site: Neck | Laterality: Left

## 2020-04-14 MED ORDER — GLYCOPYRROLATE 0.2 MG/ML IJ SOLN
INTRAMUSCULAR | Status: DC | PRN
  Administered 2020-04-14: .2 mg via INTRAVENOUS

## 2020-04-14 MED ORDER — BISACODYL 10 MG RE SUPP: 10.0000 mg | Freq: Every day | RECTAL | Status: DC | PRN

## 2020-04-14 MED ORDER — ACETAMINOPHEN 500 MG PO TABS
ORAL_TABLET | ORAL | Status: AC
  Administered 2020-04-14: 1000 mg via ORAL
  Filled 2020-04-14: qty 2

## 2020-04-14 MED ORDER — SODIUM CHLORIDE 0.9 % IV SOLN: INTRAVENOUS | Status: DC

## 2020-04-14 MED ORDER — FENTANYL CITRATE (PF) 250 MCG/5ML IJ SOLN
INTRAMUSCULAR | Status: DC | PRN
Start: 2020-04-14 — End: 2020-04-14
  Administered 2020-04-14: 50 ug via INTRAVENOUS
  Administered 2020-04-14: 100 ug via INTRAVENOUS

## 2020-04-14 MED ORDER — PROTAMINE SULFATE 10 MG/ML IV SOLN
INTRAVENOUS | Status: DC | PRN
  Administered 2020-04-14: 10 mg via INTRAVENOUS
  Administered 2020-04-14 (×2): 20 mg via INTRAVENOUS

## 2020-04-14 MED ORDER — CHLORHEXIDINE GLUCONATE 0.12 % MT SOLN: 15.0000 mL | Freq: Once | OROMUCOSAL | Status: AC

## 2020-04-14 MED ORDER — MIDAZOLAM HCL 5 MG/5ML IJ SOLN
INTRAMUSCULAR | Status: DC | PRN
  Administered 2020-04-14: 2 mg via INTRAVENOUS

## 2020-04-14 MED ORDER — ONDANSETRON HCL 4 MG/2ML IJ SOLN
INTRAMUSCULAR | Status: DC | PRN
  Administered 2020-04-14: 4 mg via INTRAVENOUS

## 2020-04-14 MED ORDER — PROPOFOL 10 MG/ML IV BOLUS
INTRAVENOUS | Status: AC
  Filled 2020-04-14: qty 20

## 2020-04-14 MED ORDER — ALBUTEROL SULFATE HFA 108 (90 BASE) MCG/ACT IN AERS: 2.0000 | INHALATION_SPRAY | RESPIRATORY_TRACT | Status: DC | PRN

## 2020-04-14 MED ORDER — METOPROLOL TARTRATE 25 MG PO TABS
25.0000 mg | ORAL_TABLET | Freq: Two times a day (BID) | ORAL | Status: DC
  Administered 2020-04-14 – 2020-04-15 (×2): 25 mg via ORAL
  Filled 2020-04-14 (×2): qty 1

## 2020-04-14 MED ORDER — ALUM & MAG HYDROXIDE-SIMETH 200-200-20 MG/5ML PO SUSP: 15.0000 mL | ORAL | Status: DC | PRN

## 2020-04-14 MED ORDER — AMISULPRIDE (ANTIEMETIC) 5 MG/2ML IV SOLN: 10.0000 mg | Freq: Once | INTRAVENOUS | Status: DC | PRN

## 2020-04-14 MED ORDER — POLYETHYLENE GLYCOL 3350 17 G PO PACK: 17.0000 g | PACK | Freq: Every day | ORAL | Status: DC | PRN

## 2020-04-14 MED ORDER — PROTAMINE SULFATE 10 MG/ML IV SOLN
INTRAVENOUS | Status: AC
  Filled 2020-04-14: qty 5

## 2020-04-14 MED ORDER — ONDANSETRON HCL 4 MG/2ML IJ SOLN
INTRAMUSCULAR | Status: AC
  Filled 2020-04-14: qty 2

## 2020-04-14 MED ORDER — PHENYLEPHRINE HCL-NACL 10-0.9 MG/250ML-% IV SOLN
INTRAVENOUS | Status: DC | PRN
  Administered 2020-04-14: 50 ug/min via INTRAVENOUS

## 2020-04-14 MED ORDER — DEXAMETHASONE SODIUM PHOSPHATE 10 MG/ML IJ SOLN
INTRAMUSCULAR | Status: AC
  Filled 2020-04-14: qty 1

## 2020-04-14 MED ORDER — HYDRALAZINE HCL 20 MG/ML IJ SOLN
INTRAMUSCULAR | Status: DC | PRN
  Administered 2020-04-14: 10 mg via INTRAVENOUS

## 2020-04-14 MED ORDER — LACTATED RINGERS IV SOLN: INTRAVENOUS | Status: DC | PRN

## 2020-04-14 MED ORDER — EPHEDRINE 5 MG/ML INJ
INTRAVENOUS | Status: AC
  Filled 2020-04-14: qty 10

## 2020-04-14 MED ORDER — ALBUTEROL SULFATE (2.5 MG/3ML) 0.083% IN NEBU: 2.5000 mg | INHALATION_SOLUTION | RESPIRATORY_TRACT | Status: DC | PRN

## 2020-04-14 MED ORDER — LIDOCAINE HCL (PF) 1 % IJ SOLN
INTRAMUSCULAR | Status: AC
  Filled 2020-04-14: qty 5

## 2020-04-14 MED ORDER — DEXAMETHASONE SODIUM PHOSPHATE 10 MG/ML IJ SOLN
INTRAMUSCULAR | Status: DC | PRN
  Administered 2020-04-14: 10 mg via INTRAVENOUS

## 2020-04-14 MED ORDER — METOPROLOL TARTRATE 5 MG/5ML IV SOLN: 2.0000 mg | INTRAVENOUS | Status: DC | PRN

## 2020-04-14 MED ORDER — CEFAZOLIN SODIUM-DEXTROSE 2-4 GM/100ML-% IV SOLN
2.0000 g | Freq: Three times a day (TID) | INTRAVENOUS | Status: AC
  Administered 2020-04-14 – 2020-04-15 (×2): 2 g via INTRAVENOUS
  Filled 2020-04-14 (×2): qty 100

## 2020-04-14 MED ORDER — PHENOL 1.4 % MT LIQD: 1.0000 | OROMUCOSAL | Status: DC | PRN

## 2020-04-14 MED ORDER — CEFAZOLIN SODIUM-DEXTROSE 2-4 GM/100ML-% IV SOLN
2.0000 g | INTRAVENOUS | Status: AC
  Administered 2020-04-14: 2 g via INTRAVENOUS

## 2020-04-14 MED ORDER — FENTANYL CITRATE (PF) 100 MCG/2ML IJ SOLN
25.0000 ug | INTRAMUSCULAR | Status: DC | PRN
  Administered 2020-04-14: 50 ug via INTRAVENOUS

## 2020-04-14 MED ORDER — ALBUTEROL SULFATE HFA 108 (90 BASE) MCG/ACT IN AERS
INHALATION_SPRAY | RESPIRATORY_TRACT | Status: AC
  Filled 2020-04-14: qty 6.7

## 2020-04-14 MED ORDER — HEPARIN SODIUM (PORCINE) 1000 UNIT/ML IJ SOLN
INTRAMUSCULAR | Status: DC | PRN
  Administered 2020-04-14: 9000 [IU] via INTRAVENOUS
  Administered 2020-04-14: 4000 [IU] via INTRAVENOUS

## 2020-04-14 MED ORDER — LIDOCAINE 2% (20 MG/ML) 5 ML SYRINGE
INTRAMUSCULAR | Status: AC
  Filled 2020-04-14: qty 10

## 2020-04-14 MED ORDER — HYDRALAZINE HCL 20 MG/ML IJ SOLN
INTRAMUSCULAR | Status: AC
  Filled 2020-04-14: qty 1

## 2020-04-14 MED ORDER — DOCUSATE SODIUM 100 MG PO CAPS
100.0000 mg | ORAL_CAPSULE | Freq: Every day | ORAL | Status: DC
  Filled 2020-04-14: qty 1

## 2020-04-14 MED ORDER — HYDRALAZINE HCL 20 MG/ML IJ SOLN: 5.0000 mg | INTRAMUSCULAR | Status: DC | PRN

## 2020-04-14 MED ORDER — MIDAZOLAM HCL 2 MG/2ML IJ SOLN
INTRAMUSCULAR | Status: AC
  Filled 2020-04-14: qty 2

## 2020-04-14 MED ORDER — CHLORHEXIDINE GLUCONATE CLOTH 2 % EX PADS: 6.0000 | MEDICATED_PAD | Freq: Once | CUTANEOUS | Status: DC

## 2020-04-14 MED ORDER — ALBUTEROL SULFATE HFA 108 (90 BASE) MCG/ACT IN AERS
INHALATION_SPRAY | RESPIRATORY_TRACT | Status: DC | PRN
  Administered 2020-04-14: 2 via RESPIRATORY_TRACT
  Administered 2020-04-14: 3 via RESPIRATORY_TRACT

## 2020-04-14 MED ORDER — HEPARIN SODIUM (PORCINE) 1000 UNIT/ML IJ SOLN
INTRAMUSCULAR | Status: AC
  Filled 2020-04-14: qty 2

## 2020-04-14 MED ORDER — ROCURONIUM BROMIDE 10 MG/ML (PF) SYRINGE
PREFILLED_SYRINGE | INTRAVENOUS | Status: DC | PRN
  Administered 2020-04-14: 20 mg via INTRAVENOUS
  Administered 2020-04-14 (×2): 50 mg via INTRAVENOUS

## 2020-04-14 MED ORDER — POTASSIUM CHLORIDE CRYS ER 20 MEQ PO TBCR: 20.0000 meq | EXTENDED_RELEASE_TABLET | Freq: Every day | ORAL | Status: DC | PRN

## 2020-04-14 MED ORDER — ORAL CARE MOUTH RINSE: 15.0000 mL | Freq: Once | OROMUCOSAL | Status: AC

## 2020-04-14 MED ORDER — CEFAZOLIN SODIUM-DEXTROSE 2-4 GM/100ML-% IV SOLN
INTRAVENOUS | Status: AC
  Filled 2020-04-14: qty 100

## 2020-04-14 MED ORDER — GUAIFENESIN-DM 100-10 MG/5ML PO SYRP: 15.0000 mL | ORAL_SOLUTION | ORAL | Status: DC | PRN

## 2020-04-14 MED ORDER — PROPOFOL 10 MG/ML IV BOLUS
INTRAVENOUS | Status: DC | PRN
  Administered 2020-04-14: 50 mg via INTRAVENOUS
  Administered 2020-04-14: 150 mg via INTRAVENOUS

## 2020-04-14 MED ORDER — EPHEDRINE SULFATE 50 MG/ML IJ SOLN
INTRAMUSCULAR | Status: DC | PRN
  Administered 2020-04-14 (×2): 5 mg via INTRAVENOUS
  Administered 2020-04-14: 10 mg via INTRAVENOUS

## 2020-04-14 MED ORDER — ASPIRIN EC 81 MG PO TBEC
81.0000 mg | DELAYED_RELEASE_TABLET | Freq: Every day | ORAL | Status: DC
  Administered 2020-04-15: 81 mg via ORAL
  Filled 2020-04-14: qty 1

## 2020-04-14 MED ORDER — NITROGLYCERIN 0.4 MG SL SUBL: 0.4000 mg | SUBLINGUAL_TABLET | SUBLINGUAL | Status: DC | PRN

## 2020-04-14 MED ORDER — CHLORHEXIDINE GLUCONATE 0.12 % MT SOLN
OROMUCOSAL | Status: AC
  Administered 2020-04-14: 15 mL via OROMUCOSAL
  Filled 2020-04-14: qty 15

## 2020-04-14 MED ORDER — ROCURONIUM BROMIDE 10 MG/ML (PF) SYRINGE
PREFILLED_SYRINGE | INTRAVENOUS | Status: AC
  Filled 2020-04-14: qty 20

## 2020-04-14 MED ORDER — SODIUM CHLORIDE 0.9 % IV SOLN: 500.0000 mL | Freq: Once | INTRAVENOUS | Status: DC | PRN

## 2020-04-14 MED ORDER — ACETAMINOPHEN 650 MG RE SUPP: 325.0000 mg | RECTAL | Status: DC | PRN

## 2020-04-14 MED ORDER — MAGNESIUM SULFATE 2 GM/50ML IV SOLN: 2.0000 g | Freq: Every day | INTRAVENOUS | Status: DC | PRN

## 2020-04-14 MED ORDER — LIDOCAINE 2% (20 MG/ML) 5 ML SYRINGE
INTRAMUSCULAR | Status: DC | PRN
  Administered 2020-04-14: 80 mg via INTRAVENOUS

## 2020-04-14 MED ORDER — LACTATED RINGERS IV SOLN: INTRAVENOUS | Status: DC

## 2020-04-14 MED ORDER — ACETAMINOPHEN 500 MG PO TABS: 1000.0000 mg | ORAL_TABLET | Freq: Once | ORAL | Status: AC

## 2020-04-14 MED ORDER — LOSARTAN POTASSIUM 50 MG PO TABS
100.0000 mg | ORAL_TABLET | Freq: Every day | ORAL | Status: DC
  Administered 2020-04-15: 100 mg via ORAL
  Filled 2020-04-14: qty 2

## 2020-04-14 MED ORDER — SODIUM CHLORIDE 0.9 % IV SOLN
INTRAVENOUS | Status: DC | PRN
  Administered 2020-04-14: 08:00:00 500 mL

## 2020-04-14 MED ORDER — ONDANSETRON HCL 4 MG/2ML IJ SOLN: 4.0000 mg | Freq: Four times a day (QID) | INTRAMUSCULAR | Status: DC | PRN

## 2020-04-14 MED ORDER — FENTANYL CITRATE (PF) 100 MCG/2ML IJ SOLN
INTRAMUSCULAR | Status: AC
  Administered 2020-04-14: 25 ug via INTRAVENOUS
  Filled 2020-04-14: qty 2

## 2020-04-14 MED ORDER — ACETAMINOPHEN 325 MG PO TABS: 325.0000 mg | ORAL_TABLET | ORAL | Status: DC | PRN

## 2020-04-14 MED ORDER — FENTANYL CITRATE (PF) 250 MCG/5ML IJ SOLN
INTRAMUSCULAR | Status: AC
  Filled 2020-04-14: qty 5

## 2020-04-14 MED ORDER — MORPHINE SULFATE (PF) 2 MG/ML IV SOLN: 2.0000 mg | INTRAVENOUS | Status: DC | PRN

## 2020-04-14 MED ORDER — OXYCODONE-ACETAMINOPHEN 5-325 MG PO TABS
1.0000 | ORAL_TABLET | ORAL | Status: DC | PRN
  Administered 2020-04-14 – 2020-04-15 (×4): 2 via ORAL
  Filled 2020-04-14 (×3): qty 2

## 2020-04-14 MED ORDER — LABETALOL HCL 5 MG/ML IV SOLN: 10.0000 mg | INTRAVENOUS | Status: DC | PRN

## 2020-04-14 MED ORDER — SUGAMMADEX SODIUM 200 MG/2ML IV SOLN
INTRAVENOUS | Status: DC | PRN
  Administered 2020-04-14: 200 mg via INTRAVENOUS

## 2020-04-14 MED ORDER — OXYCODONE-ACETAMINOPHEN 5-325 MG PO TABS
ORAL_TABLET | ORAL | Status: AC
  Filled 2020-04-14: qty 2

## 2020-04-14 MED ORDER — ATORVASTATIN CALCIUM 40 MG PO TABS
40.0000 mg | ORAL_TABLET | Freq: Every day | ORAL | Status: DC
  Administered 2020-04-15: 40 mg via ORAL
  Filled 2020-04-14: qty 1

## 2020-04-14 MED ORDER — 0.9 % SODIUM CHLORIDE (POUR BTL) OPTIME
TOPICAL | Status: DC | PRN
  Administered 2020-04-14: 2000 mL

## 2020-04-14 MED ORDER — SODIUM CHLORIDE 0.9 % IV SOLN
INTRAVENOUS | Status: AC
  Filled 2020-04-14: qty 1.2

## 2020-04-14 MED ORDER — PANTOPRAZOLE SODIUM 40 MG PO TBEC
40.0000 mg | DELAYED_RELEASE_TABLET | Freq: Every day | ORAL | Status: DC
  Administered 2020-04-15: 40 mg via ORAL
  Filled 2020-04-14: qty 1

## 2020-04-14 SURGICAL SUPPLY — 47 items
CANISTER SUCT 3000ML PPV (MISCELLANEOUS) ×3 IMPLANT
CANNULA VESSEL 3MM 2 BLNT TIP (CANNULA) ×6 IMPLANT
CATH ROBINSON RED A/P 18FR (CATHETERS) ×3 IMPLANT
CLIP LIGATING EXTRA MED SLVR (CLIP) ×3 IMPLANT
CLIP LIGATING EXTRA SM BLUE (MISCELLANEOUS) ×3 IMPLANT
DECANTER SPIKE VIAL GLASS SM (MISCELLANEOUS) IMPLANT
DERMABOND ADVANCED (GAUZE/BANDAGES/DRESSINGS) ×2
DERMABOND ADVANCED .7 DNX12 (GAUZE/BANDAGES/DRESSINGS) ×1 IMPLANT
DRAIN HEMOVAC 1/8 X 5 (WOUND CARE) IMPLANT
ELECT BLADE 4.0 EZ CLEAN MEGAD (MISCELLANEOUS) ×3
ELECT REM PT RETURN 9FT ADLT (ELECTROSURGICAL) ×3
ELECTRODE BLDE 4.0 EZ CLN MEGD (MISCELLANEOUS) ×1 IMPLANT
ELECTRODE REM PT RTRN 9FT ADLT (ELECTROSURGICAL) ×1 IMPLANT
EVACUATOR SILICONE 100CC (DRAIN) IMPLANT
GLOVE BIO SURGEON STRL SZ 6.5 (GLOVE) ×6 IMPLANT
GLOVE BIO SURGEONS STRL SZ 6.5 (GLOVE) ×3
GLOVE BIOGEL PI IND STRL 6.5 (GLOVE) ×4 IMPLANT
GLOVE BIOGEL PI INDICATOR 6.5 (GLOVE) ×8
GLOVE SS BIOGEL STRL SZ 7 (GLOVE) ×1 IMPLANT
GLOVE SS BIOGEL STRL SZ 7.5 (GLOVE) ×1 IMPLANT
GLOVE SUPERSENSE BIOGEL SZ 7 (GLOVE) ×2
GLOVE SUPERSENSE BIOGEL SZ 7.5 (GLOVE) ×2
GOWN STRL REUS W/ TWL LRG LVL3 (GOWN DISPOSABLE) ×5 IMPLANT
GOWN STRL REUS W/TWL LRG LVL3 (GOWN DISPOSABLE) ×15
KIT BASIN OR (CUSTOM PROCEDURE TRAY) ×3 IMPLANT
KIT SHUNT ARGYLE CAROTID ART 6 (VASCULAR PRODUCTS) IMPLANT
KIT TURNOVER KIT B (KITS) ×3 IMPLANT
NEEDLE 22X1 1/2 (OR ONLY) (NEEDLE) IMPLANT
NS IRRIG 1000ML POUR BTL (IV SOLUTION) ×6 IMPLANT
PACK CAROTID (CUSTOM PROCEDURE TRAY) ×3 IMPLANT
PAD ARMBOARD 7.5X6 YLW CONV (MISCELLANEOUS) ×6 IMPLANT
PATCH HEMASHIELD 8X75 (Vascular Products) ×3 IMPLANT
PENCIL BUTTON HOLSTER BLD 10FT (ELECTRODE) ×3 IMPLANT
POSITIONER HEAD DONUT 9IN (MISCELLANEOUS) ×3 IMPLANT
SHUNT CAROTID BYPASS 10 (VASCULAR PRODUCTS) ×3 IMPLANT
SHUNT CAROTID BYPASS 12FRX15.5 (VASCULAR PRODUCTS) IMPLANT
SPONGE LAP 18X18 RF (DISPOSABLE) ×3 IMPLANT
SUT ETHILON 3 0 PS 1 (SUTURE) IMPLANT
SUT PROLENE 6 0 CC (SUTURE) ×15 IMPLANT
SUT SILK 3 0 (SUTURE)
SUT SILK 3-0 18XBRD TIE 12 (SUTURE) IMPLANT
SUT VIC AB 3-0 SH 27 (SUTURE) ×6
SUT VIC AB 3-0 SH 27X BRD (SUTURE) ×2 IMPLANT
SUT VICRYL 4-0 PS2 18IN ABS (SUTURE) ×3 IMPLANT
SYR CONTROL 10ML LL (SYRINGE) IMPLANT
TOWEL GREEN STERILE (TOWEL DISPOSABLE) ×3 IMPLANT
WATER STERILE IRR 1000ML POUR (IV SOLUTION) ×3 IMPLANT

## 2020-04-14 NOTE — Anesthesia Postprocedure Evaluation (Signed)
Anesthesia Post Note  Patient: Juan Martinez  Procedure(s) Performed: LEFT CAROTID ENDARTERECTOMY (Left Neck) PATCH ANGIOPLASTY USING HEMASHIELD PLATINUM FINESSE PATCH (Left Neck)     Patient location during evaluation: PACU Anesthesia Type: General Level of consciousness: awake and alert Pain management: pain level controlled Vital Signs Assessment: post-procedure vital signs reviewed and stable Respiratory status: spontaneous breathing, nonlabored ventilation, respiratory function stable and patient connected to nasal cannula oxygen Cardiovascular status: blood pressure returned to baseline and stable Postop Assessment: no apparent nausea or vomiting Anesthetic complications: no   No complications documented.  Last Vitals:  Vitals:   04/14/20 1035 04/14/20 1040  BP:  132/62  Pulse: 82 73  Resp: (!) 33 (!) 21  Temp: 37 C   SpO2: 97% 96%    Last Pain:  Vitals:   04/14/20 1110  TempSrc:   PainSc: 0-No pain                 [name]

## 2020-04-14 NOTE — Op Note (Signed)
OPERATIVE REPORT  DATE OF SURGERY: 04/14/2020  PATIENT: Juan Martinez, 64 y.o. male MRN: [id number]  DOB: Aug 05, 1955  PRE-OPERATIVE DIAGNOSIS: Left Carotid Stenosis, Asymptomatic  POST-OPERATIVE DIAGNOSIS:  Same  PROCEDURE:  Left Carotid Endarterectomy with Dacron Patch Angioplasty  SURGEON:  Curt Jews, M.D.  PHYSICIAN ASSISTANT: Rhyne PAC  The assistant was needed for exposure and to expedite the case  ANESTHESIA:   general  EBL: Less than 200 ml  Total I/O In: 1100 [I.V.:1000; IV Piggyback:100] Out: 75 [Blood:75]  BLOOD ADMINISTERED: none  DRAINS: none   SPECIMEN: none  COUNTS CORRECT:  YES  PLAN OF CARE: Admit to inpatient   PATIENT DISPOSITION:  PACU - hemodynamically stable and neurologically intact.  PROCEDURE DETAILS: The patient was taken to the operating room placed in supine position.  General anesthesia was administered.  The neck was prepped and draped in the usual sterile fashion.  An incision was made anterior to the sternocleidomastoid and carried down through the platysma with electrocautery.  The sternocleidomastoid was reflected posteriorly and the carotid sheath was opened.  The facial vein was ligated with 2-0 silk ties and divided.  The common carotid artery was encircled with an umbilical tape and Rummel tourniquet.  The vagus nerve was identified and preserved.  Dissection was continued onto the carotid bifurcation.  The superior thyroid artery was encircled with a 2-0 silk Potts tie.  The external carotid was encircled with a blue vessel loop and the internal carotid was encircled with an umbilical tape and Rummel tourniquet.  The hypoglossal nerve was identified and preserved.  The patient was given systemic heparin and after adequate circulation time, the internal, external and common carotid arteries were occluded with vascular clamps.  The common carotid artery was opened with an 11 blade and extended  longitudinally with Potts scissors.  A 10  shunt was passed up the internal carotid and allowed to backbleed.  It was then passed down the common carotid where it was secured with Rummel tourniquet.  The endarterectomy was begun on the common carotid artery and the plaque was divided proximally with Potts scissors.  The endarterectomy was continued onto the bifurcation.  The external carotid was endarterectomized with an eversion technique and the internal carotid was endarterectomized in an open fashion.  Remaining atheromatous debris was removed from the endarterectomy plane.  A Finesse Hemashield Dacron patch was brought onto the field and was sewn as a patch angioplasty with a running 6-0 Prolene suture.  Prior to completion of the closure the shunt was removed and the usual flushing maneuvers were undertaken.  The anastomosis was completed and flow was restored first to the external and then the internal carotid artery.  There were excellent flow characteristics in the common carotid and external carotid.  There did appear to be high velocity flows in the internal carotid artery.  For this reason the patient was given an additional 5000 of intravenous heparin.  The common external and internal carotid arteries were occluded.  The distal portion of the patch was reopened.  The distal endpoint again was visualized and there was no evidence of flap.  A 3 dilator passed easily through the internal carotid proximally.  The patch was resewn and the usual flushing maneuvers were undertaken in the anastomosis was completed.  There were normal flow characteristics in the internal carotid artery..  The patient was given 50 mg of protamine to reverse the heparin.  The wounds were irrigated with saline.  Hemostasis was obtained with  electrocautery.  The wounds were closed with 3-0 Vicryl to reapproximate the sternocleidomastoid over the carotid sheath.  The platysma was lysed with a running 3-0 Vicryl suture.  The skin was closed with a 4-0 subcuticular Vicryl  stitch.  Dermabond was applied.  The patient was awakened neurologically intact in the operating room and transferred to the recovery room in stable condition   Curt Jews, M.D. 04/14/2020 10:35 AM

## 2020-04-14 NOTE — Anesthesia Procedure Notes (Signed)
Procedure Name: Intubation Date/Time: 04/14/2020 7:39 AM Performed by: Mariea Clonts, CRNA Pre-anesthesia Checklist: Patient identified, Emergency Drugs available, Suction available and Patient being monitored Patient Re-evaluated:Patient Re-evaluated prior to induction Oxygen Delivery Method: Circle System Utilized Preoxygenation: Pre-oxygenation with 100% oxygen Induction Type: IV induction Ventilation: Mask ventilation without difficulty Laryngoscope Size: Miller and 2 Grade View: Grade I Tube type: Oral Tube size: 7.5 mm Number of attempts: 1 Airway Equipment and Method: Stylet and Oral airway Placement Confirmation: ETT inserted through vocal cords under direct vision,  positive ETCO2 and breath sounds checked- equal and bilateral Tube secured with: Tape Dental Injury: Teeth and Oropharynx as per pre-operative assessment

## 2020-04-14 NOTE — Discharge Instructions (Signed)
   [facility]  Discharge Instructions   Carotid Surgery  Please refer to the following instructions for your post-procedure care. Your surgeon or physician assistant will discuss any changes with you.  Activity  You are encouraged to walk as much as you can. You can slowly return to normal activities but must avoid strenuous activity and heavy lifting until your doctor tell you it's okay. Avoid activities such as vacuuming or swinging a golf club. You can drive after one week if you are comfortable and you are no longer taking prescription pain medications. It is normal to feel tired for serval weeks after your surgery. It is also normal to have difficulty with sleep habits, eating, and bowel movements after surgery. These will go away with time.  Bathing/Showering  Shower daily after you go home. Do not soak in a bathtub, hot tub, or swim until the incision heals completely.  Incision Care  Shower every day. Clean your incision with mild soap and water. Pat the area dry with a clean towel. You do not need a bandage unless otherwise instructed. Do not apply any ointments or creams to your incision. You may have skin glue on your incision. Do not peel it off. It will come off on its own in about one week. Your incision may feel thickened and raised for several weeks after your surgery. This is normal and the skin will soften over time.   For Men Only: It's okay to shave around the incision but do not shave the incision itself for 2 weeks. It is common to have numbness under your chin that could last for several months.  Diet  Resume your normal diet. There are no special food restrictions following this procedure. A low fat/low cholesterol diet is recommended for all patients with vascular disease. In order to heal from your surgery, it is CRITICAL to get adequate nutrition. Your body requires vitamins, minerals, and protein. Vegetables are the best source of  vitamins and minerals. Vegetables also provide the perfect balance of protein. Processed food has little nutritional value, so try to avoid this.  Medications  Resume taking all of your medications unless your doctor or physician assistant tells you not to. If your incision is causing pain, you may take over-the- counter pain relievers such as acetaminophen (Tylenol). If you were prescribed a stronger pain medication, please be aware these medications can cause nausea and constipation. Prevent nausea by taking the medication with a snack or meal. Avoid constipation by drinking plenty of fluids and eating foods with a high amount of fiber, such as fruits, vegetables, and grains.   Do not take Tylenol if you are taking prescription pain medications.  Follow Up  Our office will schedule a follow up appointment 2-3 weeks following discharge.  Please call us immediately for any of the following conditions  . Increased pain, redness, drainage (pus) from your incision site. . Fever of 101 degrees or higher. . If you should develop stroke (slurred speech, difficulty swallowing, weakness on one side of your body, loss of vision) you should call 911 and go to the nearest emergency room. .  Reduce your risk of vascular disease:  . Stop smoking. If you would like help call [facility] at 1-800-QUIT-NOW (1-800-784-8669) or [facility] at 336-586-4000. . Manage your cholesterol . Maintain a desired weight . Control your diabetes . Keep your blood pressure down .  If you have any questions, please call the office at [phone]. 

## 2020-04-14 NOTE — Transfer of Care (Signed)
Immediate Anesthesia Transfer of Care Note  Patient: Juan Martinez  Procedure(s) Performed: LEFT CAROTID ENDARTERECTOMY (Left Neck) PATCH ANGIOPLASTY USING HEMASHIELD PLATINUM FINESSE PATCH (Left Neck)  Patient Location: PACU  Anesthesia Type:General  Level of Consciousness: awake, alert  and oriented  Airway & Oxygen Therapy: Patient Spontanous Breathing and Patient connected to nasal cannula oxygen  Post-op Assessment: Report given to RN, Post -op Vital signs reviewed and stable and Patient moving all extremities X 4  Post vital signs: Reviewed and stable  Last Vitals:  Vitals Value Taken Time  BP 132/62 04/14/20 1040  Temp 37 C 04/14/20 1035  Pulse 72 04/14/20 1041  Resp 19 04/14/20 1041  SpO2 95 % 04/14/20 1041  Vitals shown include unvalidated device data.  Last Pain:  Vitals:   04/14/20 0551  TempSrc: Oral  PainSc: 0-No pain      Patients Stated Pain Goal: 4 (16/07/37 1062)  Complications: No complications documented.

## 2020-04-14 NOTE — Progress Notes (Signed)
  Day of Surgery Note    Subjective:  Doing well in recovery.  Disappointed he's not going home today.    Vitals:   04/14/20 1035 04/14/20 1040  BP:  132/62  Pulse: 82 73  Resp: (!) 33 (!) 21  Temp: 98.6 F (37 C)   SpO2: 97% 96%    Incisions:   Left neck incision is clean without hematoma Extremities:  Moving all extremities equally.   Cardiac:  regular Lungs:  Non labored Neuro:  In tact; Tongue is midline   Assessment/Plan:  This is a 64 y.o. male who is s/p  Left carotid endarterectomy  -doing well in recovery and neuro in tact.   -anticipate discharge tomorrow if no issues overnight   Leontine Locket, PA-C 04/14/2020 11:47 AM [phone]

## 2020-04-14 NOTE — Interval H&P Note (Signed)
History and Physical Interval Note:  04/14/2020 7:19 AM  Juan Martinez  has presented today for surgery, with the diagnosis of LEFT CAROTID ARTERY STENOSIS.  The various methods of treatment have been discussed with the patient and family. After consideration of risks, benefits and other options for treatment, the patient has consented to  Procedure(s): ENDARTERECTOMY CAROTID (Left) as a surgical intervention.  The patient's history has been reviewed, patient examined, no change in status, stable for surgery.  I have reviewed the patient's chart and labs.  Questions were answered to the patient's satisfaction.     Curt Jews

## 2020-04-14 NOTE — Anesthesia Procedure Notes (Signed)
Arterial Line Insertion Start/End10/15/2021 7:10 AM, 04/14/2020 7:15 AM Performed by: CRNA  Patient location: Pre-op. Preanesthetic checklist: patient identified, IV checked, site marked, risks and benefits discussed, surgical consent, monitors and equipment checked, pre-op evaluation, timeout performed and anesthesia consent Lidocaine 1% used for infiltration Left, radial was placed Catheter size: 20 G Hand hygiene performed  and maximum sterile barriers used   Attempts: 1 Procedure performed without using ultrasound guided technique. Following insertion, dressing applied and Biopatch. Post procedure assessment: normal  Patient tolerated the procedure well with no immediate complications.

## 2020-04-14 NOTE — Progress Notes (Signed)
Notified Deatra Canter MD of patients pre-op blood pressure.  No orders reviewed will continue to monitor patient.  Plan to treat pressure in the OR

## 2020-04-15 DIAGNOSIS — I6522 Occlusion and stenosis of left carotid artery: Secondary | ICD-10-CM | POA: Diagnosis not present

## 2020-04-15 LAB — CBC
HCT: 34.1 % — ABNORMAL LOW (ref 39.0–52.0)
Hemoglobin: 11.2 g/dL — ABNORMAL LOW (ref 13.0–17.0)
MCH: 31.6 pg (ref 26.0–34.0)
MCHC: 32.8 g/dL (ref 30.0–36.0)
MCV: 96.3 fL (ref 80.0–100.0)
Platelets: 143 10*3/uL — ABNORMAL LOW (ref 150–400)
RBC: 3.54 MIL/uL — ABNORMAL LOW (ref 4.22–5.81)
RDW: 14.2 % (ref 11.5–15.5)
WBC: 13.6 10*3/uL — ABNORMAL HIGH (ref 4.0–10.5)
nRBC: 0 % (ref 0.0–0.2)

## 2020-04-15 LAB — BASIC METABOLIC PANEL
Anion gap: 9 (ref 5–15)
BUN: 22 mg/dL (ref 8–23)
CO2: 24 mmol/L (ref 22–32)
Calcium: 8.5 mg/dL — ABNORMAL LOW (ref 8.9–10.3)
Chloride: 104 mmol/L (ref 98–111)
Creatinine, Ser: 1.31 mg/dL — ABNORMAL HIGH (ref 0.61–1.24)
GFR, Estimated: 56 mL/min — ABNORMAL LOW (ref >60)
Glucose, Bld: 138 mg/dL — ABNORMAL HIGH (ref 70–99)
Potassium: 4.5 mmol/L (ref 3.5–5.1)
Sodium: 137 mmol/L (ref 135–145)

## 2020-04-15 MED ORDER — OXYCODONE-ACETAMINOPHEN 5-325 MG PO TABS: 1.0000 | ORAL_TABLET | Freq: Four times a day (QID) | ORAL | 0 refills | Status: DC | PRN

## 2020-04-15 NOTE — Discharge Summary (Addendum)
Discharge Summary     Juan Martinez Jul 06, 1955 64 y.o. male  [id number]  Admission Date: 04/14/2020  Discharge Date: 04/15/2020  Physician: Rosetta Posner, MD  Admission Diagnosis: Asymptomatic carotid artery stenosis without infarction, left [I65.22]   HPI:   This is a 64 y.o. male who is here today for discussion of recent duplex.  He is here today with his daughter.  He denies any prior neurologic deficits.  No history of amaurosis fugax, aphasia, TIA or stroke.  He was found to have a carotid bruit and underwent duplex on 03/02/2020.  This revealed critical left internal carotid artery stenosis at the bifurcation with normal internal carotid distal.  Right is 40 to 59% stenosis.  He does have prior history of coronary artery disease with prior coronary stenting in 2012.  He has remained stable from this standpoint.  Hospital Course:  The patient was admitted to the hospital and taken to the operating room on 04/14/2020 and underwent left carotid endarterectomy.    The pt tolerated the procedure well and was transported to the PACU in good condition.   By POD 1, the pt neuro status in tact.  No swallowing difficulties.  He has ambulated and voided.    The remainder of the hospital course consisted of increasing mobilization and increasing intake of solids without difficulty.   Recent Labs    04/15/20 0403  NA 137  K 4.5  CL 104  CO2 24  GLUCOSE 138*  BUN 22  CALCIUM 8.5*   Recent Labs    04/15/20 0403  WBC 13.6*  HGB 11.2*  HCT 34.1*  PLT 143*   No results for input(s): INR in the last 72 hours.   Discharge Instructions    Discharge patient   Complete by: As directed    Discharge disposition: 01-Home or Self Care   Discharge patient date: 04/15/2020      Discharge Diagnosis:  Asymptomatic carotid artery stenosis without infarction, left [I65.22]  Secondary Diagnosis: Patient Active Problem List   Diagnosis Date Noted  . Asymptomatic carotid  artery stenosis without infarction, left 04/14/2020  . Essential hypertension 02/24/2020  . Educated about COVID-19 virus infection 02/24/2020  . Dyslipidemia 02/24/2020  . Diarrhea 12/02/2017  . Rectal bleeding 12/02/2017  . Family hx of colon cancer 12/02/2017  . Hypercholesterolemia 04/01/2011  . Tobacco abuse 04/01/2011  . CAD 08/06/2010   Past Medical History:  Diagnosis Date  . CAD (coronary artery disease), native coronary artery 03/2011   NSTEMI of Cx   . COPD (chronic obstructive pulmonary disease) ([place])   . Coronary artery disease 07/2010   NSTEMI of RCA inferior  . GERD (gastroesophageal reflux disease)   . History of kidney stones   . Hypertension   . MI (myocardial infarction) ([place])   . Pneumonia    walking pneumonia    Allergies as of 04/15/2020   No Known Allergies     Medication List    STOP taking these medications   ibuprofen 800 MG tablet Commonly known as: ADVIL   traMADol 50 MG tablet Commonly known as: ULTRAM     TAKE these medications   acetaminophen 500 MG tablet Commonly known as: TYLENOL Take 1,000 mg by mouth every 6 (six) hours as needed for moderate pain or headache.   albuterol 108 (90 Base) MCG/ACT inhaler Commonly known as: VENTOLIN HFA Inhale 2 puffs into the lungs every 4 (four) hours as needed for wheezing or shortness of breath.   aspirin 81  MG tablet Take 81 mg by mouth daily.   atorvastatin 40 MG tablet Commonly known as: LIPITOR Take 1 tablet (40 mg total) by mouth daily.   losartan 100 MG tablet Commonly known as: COZAAR Take 1 tablet (100 mg total) by mouth daily.   metoprolol tartrate 25 MG tablet Commonly known as: LOPRESSOR TAKE 1 TABLET BY MOUTH TWICE A DAY   nitroGLYCERIN 0.4 MG SL tablet Commonly known as: Nitrostat PLACE 1 TABLET UNDER THE TONGUE EVERY 5 MINUTES UP TO 3 DOSES What changed:   how much to take  how to take this  when to take this  reasons to take this  additional instructions    omeprazole 20 MG capsule Commonly known as: PRILOSEC Take 1 capsule (20 mg total) by mouth daily.   oxyCODONE-acetaminophen 5-325 MG tablet Commonly known as: Percocet Take 1 tablet by mouth every 6 (six) hours as needed for severe pain.        [facility] Discharge Instructions Carotid Endarterectomy (CEA)  Please refer to the following instructions for your post-procedure care. Your surgeon or physician assistant will discuss any changes with you.  Activity  You are encouraged to walk as much as you can. You can slowly return to normal activities but must avoid strenuous activity and heavy lifting until your doctor tell you it's OK. Avoid activities such as vacuuming or swinging a golf club. You can drive after one week if you are comfortable and you are no longer taking prescription pain medications. It is normal to feel tired for serval weeks after your surgery. It is also normal to have difficulty with sleep habits, eating, and bowel movements after surgery. These will go away with time.  Bathing/Showering  You may shower after you come home. Do not soak in a bathtub, hot tub, or swim until the incision heals completely.  Incision Care  Shower every day. Clean your incision with mild soap and water. Pat the area dry with a clean towel. You do not need a bandage unless otherwise instructed. Do not apply any ointments or creams to your incision. You may have skin glue on your incision. Do not peel it off. It will come off on its own in about one week. Your incision may feel thickened and raised for several weeks after your surgery. This is normal and the skin will soften over time. For Men Only: It's OK to shave around the incision but do not shave the incision itself for 2 weeks. It is common to have numbness under your chin that could last for several months.  Diet  Resume your normal diet. There are no special food restrictions following this  procedure. A low fat/low cholesterol diet is recommended for all patients with vascular disease. In order to heal from your surgery, it is CRITICAL to get adequate nutrition. Your body requires vitamins, minerals, and protein. Vegetables are the best source of vitamins and minerals. Vegetables also provide the perfect balance of protein. Processed food has little nutritional value, so try to avoid this.  Medications  Resume taking all of your medications unless your doctor or physician assistant tells you not to.  If your incision is causing pain, you may take over-the- counter pain relievers such as acetaminophen (Tylenol). If you were prescribed a stronger pain medication, please be aware these medications can cause nausea and constipation.  Prevent nausea by taking the medication with a snack or meal. Avoid constipation by drinking plenty of fluids and  eating foods with a high amount of fiber, such as fruits, vegetables, and grains.  Do not take Tylenol if you are taking prescription pain medications.  Follow Up  Our office will schedule a follow up appointment 2-3 weeks following discharge.  Please call us immediately for any of the following conditions  . Increased pain, redness, drainage (pus) from your incision site. . Fever of 101 degrees or higher. . If you should develop stroke (slurred speech, difficulty swallowing, weakness on one side of your body, loss of vision) you should call 911 and go to the nearest emergency room. .  Reduce your risk of vascular disease:  . Stop smoking. If you would like help call [facility] at 1-800-QUIT-NOW 939-157-1400) or [place] at ([phone])176-3931. . Manage your cholesterol . Maintain a desired weight . Control your diabetes . Keep your blood pressure down .  If you have any questions, please call the office at [phone].  Prescriptions given: 1.   Roxicet #8 No Refill  Disposition: home  Patient's condition: is Good  Follow up: 1.  Dr. Donnetta Hutching in 2 weeks in [place] office.  2.  PCP next week to check renal function.   Leontine Locket, PA-C [facility] [phone]   --- For [facility] use ---   Modified Rankin score at D/C (0-6): 0  IV medication needed for:  1. Hypertension: No 2. Hypotension: No  Post-op Complications: No  1. Post-op CVA or TIA: No  If yes: Event classification (right eye, left eye, right cortical, left cortical, verterobasilar, other): n/a  If yes: Timing of event (intra-op, <6 hrs post-op, >=6 hrs post-op, unknown): n/a  2. CN injury: No  If yes: CN n/a injuried   3. Myocardial infarction: No  If yes: Dx by (EKG or clinical, Troponin): n/a  4.  CHF: No  5.  Dysrhythmia (new): No  6. Wound infection: No  7. Reperfusion symptoms: No  8. Return to OR: No  If yes: return to OR for (bleeding, neurologic, other CEA incision, other): n/a  Discharge medications: Statin use:  Yes ASA use:  Yes   Beta blocker use:  Yes ACE-Inhibitor use:  No  ARB use:  Yes CCB use: No P2Y12 Antagonist use: No, [ ]  Plavix, [ ]  Plasugrel, [ ]  Ticlopinine, [ ]  Ticagrelor, [ ]  Other, [ ]  No for medical reason, [ ]  Non-compliant, [ ]  Not-indicated Anti-coagulant use:  No, [ ]  Warfarin, [ ]  Rivaroxaban, [ ]  Dabigatran,

## 2020-04-15 NOTE — Progress Notes (Addendum)
  Progress Note    04/15/2020 8:51 AM 1 Day Post-Op  Subjective:  No complaints; wants to go home. No trouble swallowing.   Afebrile HR 50's-60's NSR 488'Q-916'X systolic 45% RA  Gtts:  None  Vitals:   04/15/20 0600 04/15/20 0746  BP: 124/69 124/73  Pulse: (!) 56 62  Resp: 18 18  Temp: 97.7 F (36.5 C) 98.5 F (36.9 C)  SpO2: 97% 98%     Physical Exam: Neuro:  In tact; tongue is midline Lungs:  Non labored Incision:  Clean and dry  CBC    Component Value Date/Time   WBC 13.6 (H) 04/15/2020 0403   RBC 3.54 (L) 04/15/2020 0403   HGB 11.2 (L) 04/15/2020 0403   HCT 34.1 (L) 04/15/2020 0403   PLT 143 (L) 04/15/2020 0403   MCV 96.3 04/15/2020 0403   MCH 31.6 04/15/2020 0403   MCHC 32.8 04/15/2020 0403   RDW 14.2 04/15/2020 0403   LYMPHSABS 1.4 03/21/2011 1838   MONOABS 0.8 03/21/2011 1838   EOSABS 0.2 03/21/2011 1838   BASOSABS 0.0 03/21/2011 1838    BMET    Component Value Date/Time   NA 137 04/15/2020 0403   K 4.5 04/15/2020 0403   CL 104 04/15/2020 0403   CO2 24 04/15/2020 0403   GLUCOSE 138 (H) 04/15/2020 0403   BUN 22 04/15/2020 0403   CREATININE 1.31 (H) 04/15/2020 0403   CALCIUM 8.5 (L) 04/15/2020 0403   GFRNONAA 56 (L) 04/15/2020 0403   GFRAA >60 12/13/2017 1836     Intake/Output Summary (Last 24 hours) at 04/15/2020 0851 Last data filed at 04/14/2020 1532 Gross per 24 hour  Intake 1760 ml  Output 575 ml  Net 1185 ml     Assessment/Plan:  This is a 64 y.o. male who is s/p left CEA 1 Day Post-Op  -pt is doing well this am. -pt neuro exam is in tact -slight bump in creatinine this morning, but he has had this before.  Will have pt f/u with his PCP next week to check his kidney function.  -pt has ambulated -pt has voided -f/u with Dr. Donnetta Hutching in 2 weeks in [place] office.   -PDMP reviewed prior to prescribiing narcotics for post operative pain.   Leontine Locket, PA-C [facility] [phone]  I have seen and  evaluated the patient. I agree with the PA note as documented above.  Postop day 1 status post left carotid endarterectomy for asymptomatic high-grade stenosis with Dr. Donnetta Hutching.  Neck incision looks good this morning with no hematoma.  Neurologically intact.  He he wants to go home this morning.  Discussed plan for follow-up in 2 to 3 weeks for wound check.  Also discussed following up with his PCP next week for a BMP check given his creatinine had a slight bump from 0.98 to 1.31.  States he is voiding without issue.  Continue aspirin and statin.  Marty Heck, MD [facility] [place] Office: [phone]

## 2020-04-17 ENCOUNTER — Encounter (HOSPITAL_COMMUNITY): Payer: Self-pay | Admitting: Vascular Surgery

## 2020-05-01 ENCOUNTER — Encounter: Payer: Self-pay | Admitting: Vascular Surgery

## 2020-05-01 ENCOUNTER — Other Ambulatory Visit: Payer: Self-pay

## 2020-05-01 ENCOUNTER — Other Ambulatory Visit: Payer: Self-pay | Admitting: Family Medicine

## 2020-05-01 ENCOUNTER — Ambulatory Visit (INDEPENDENT_AMBULATORY_CARE_PROVIDER_SITE_OTHER): Payer: Self-pay | Admitting: Vascular Surgery

## 2020-05-01 VITALS — BP 168/85 | HR 58 | Temp 97.5°F | Resp 16 | Ht 69.0 in | Wt 192.0 lb

## 2020-05-01 DIAGNOSIS — I6522 Occlusion and stenosis of left carotid artery: Secondary | ICD-10-CM

## 2020-05-01 NOTE — Progress Notes (Signed)
   [facility] [place]  Patient name: Juan Martinez MRN: [id number] DOB: 01-31-1956 Sex: male  REASON FOR VISIT: Follow-up left carotid endarterectomy for severe asymptomatic stenosis on 04/14/2020  HPI: Juan Martinez is a 64 y.o. male here today for follow-up.  He did well and was discharged home on postoperative day 1.  He has had no neurologic deficits.  He does have the usual periincisional numbness and reports some numbness in his earlobe on the left which is improving as well.  Current Outpatient Medications  Medication Sig Dispense Refill  . acetaminophen (TYLENOL) 500 MG tablet Take 1,000 mg by mouth every 6 (six) hours as needed for moderate pain or headache.     . albuterol (VENTOLIN HFA) 108 (90 Base) MCG/ACT inhaler Inhale 2 puffs into the lungs every 4 (four) hours as needed for wheezing or shortness of breath. 8 g 0  . aspirin 81 MG tablet Take 81 mg by mouth daily.      Marland Kitchen atorvastatin (LIPITOR) 40 MG tablet Take 1 tablet (40 mg total) by mouth daily. 90 tablet 1  . losartan (COZAAR) 100 MG tablet Take 1 tablet (100 mg total) by mouth daily. 90 tablet 3  . metoprolol tartrate (LOPRESSOR) 25 MG tablet TAKE 1 TABLET BY MOUTH TWICE A DAY (Patient taking differently: Take 25 mg by mouth 2 (two) times daily. ) 60 tablet 6  . nitroGLYCERIN (NITROSTAT) 0.4 MG SL tablet PLACE 1 TABLET UNDER THE TONGUE EVERY 5 MINUTES UP TO 3 DOSES (Patient taking differently: Place 0.4 mg under the tongue every 5 (five) minutes as needed for chest pain. ) 25 tablet 3  . omeprazole (PRILOSEC) 20 MG capsule Take 1 capsule (20 mg total) by mouth daily. 90 capsule 3  . oxyCODONE-acetaminophen (PERCOCET) 5-325 MG tablet Take 1 tablet by mouth every 6 (six) hours as needed for severe pain. 8 tablet 0   No current facility-administered medications for this visit.     PHYSICAL EXAM: Vitals:   05/01/20 1124  BP: (!) 168/85  Pulse: (!) 58  Resp: 16    Temp: (!) 97.5 F (36.4 C)  TempSrc: Other (Comment)  SpO2: 99%  Weight: 192 lb (87.1 kg)  Height: 5\' 9"  (1.753 m)    GENERAL: The patient is a well-nourished male, in no acute distress. The vital signs are documented above. Carotid arteries without bruits bilaterally.  Well-healed left carotid incision.  Neurologically intact  MEDICAL ISSUES: Stable status post left carotid endarterectomy for severe asymptomatic disease.  Will be released to return to work on 05/08/2020 with no restrictions.  We will see him again in 9 months with repeat carotid duplex.  He does have moderate asymptomatic right carotid stenosis.  He knows to notify us immediately should he develop any neurologic deficits or wound issues   Rosetta Posner, MD [facility] Tel [phone]

## 2020-06-06 ENCOUNTER — Other Ambulatory Visit: Payer: Self-pay | Admitting: *Deleted

## 2020-06-06 MED ORDER — METOPROLOL TARTRATE 25 MG PO TABS
25.0000 mg | ORAL_TABLET | Freq: Two times a day (BID) | ORAL | 6 refills | Status: DC
Start: 2020-06-06 — End: 2021-03-12

## 2020-06-09 ENCOUNTER — Other Ambulatory Visit: Payer: Self-pay | Admitting: Cardiology

## 2020-07-05 ENCOUNTER — Other Ambulatory Visit: Payer: Self-pay | Admitting: Cardiology

## 2020-07-26 ENCOUNTER — Other Ambulatory Visit: Payer: Self-pay | Admitting: Cardiology

## 2020-07-29 ENCOUNTER — Other Ambulatory Visit: Payer: Self-pay | Admitting: Cardiology

## 2020-10-19 ENCOUNTER — Other Ambulatory Visit: Payer: Self-pay | Admitting: Cardiology

## 2020-12-17 ENCOUNTER — Other Ambulatory Visit: Payer: Self-pay | Admitting: Cardiology

## 2020-12-30 ENCOUNTER — Other Ambulatory Visit: Payer: Self-pay | Admitting: Cardiology

## 2021-02-12 NOTE — Progress Notes (Signed)
Cardiology Office Note   Date:  02/14/2021   ID:  Juan Martinez, DOB Apr 16, 1956, MRN [id number]  PCP:  [facility]  Cardiologist:   Minus Breeding, MD   Chief Complaint  Patient presents with   Coronary Artery Disease       History of Present Illness: Juan Martinez is a 65 y.o. male who presents for follow up of NSTEMI.   He was seen by Dr. Bronson Ing.  He had a BMS I the RCA in Jan 2012.  He underwent repeat coronary angiography in September 2012 for a non-STEMI and he had 100% occlusion of the midcircumflex after the OM1.  Nuclear stress test 02/03/17 showed large inferior wall infarct from apex to base with no evidence of ischemia, LVEF 42%.  Echocardiogram showed mildly reduced left ventricular systolic function, LVEF [id number], mid and basal inferior wall akinesis, and mild mitral regurgitation in 2018.  Since I last saw him he had CEA.  He had no new cardiovascular complaints.  He still works actively in Information systems manager. The patient denies any new symptoms such as chest discomfort, neck or arm discomfort. There has been no new shortness of breath, PND or orthopnea. There have been no reported palpitations, presyncope or syncope.    Past Medical History:  Diagnosis Date   CAD (coronary artery disease), native coronary artery 03/2011   NSTEMI of Cx    COPD (chronic obstructive pulmonary disease) ([facility])    Coronary artery disease 07/2010   NSTEMI of RCA inferior   GERD (gastroesophageal reflux disease)    History of kidney stones    Hypertension    MI (myocardial infarction) ([name])    Pneumonia    walking pneumonia    Past Surgical History:  Procedure Laterality Date   ANKLE FRACTURE SURGERY Right    CARDIAC CATHETERIZATION  03/21/2011   Single vessel occlusive atheroscleotic CAD.  The mid CX is occluded which is a new finding compared to January of 2012. Continued patency fo the stent in the RCA. Moderate LV dysfx with EF of 40-45%.    CAROTID STENT     pt denies, pt has no scar on either side of his neck   COLONOSCOPY N/A 12/29/2017   Procedure: COLONOSCOPY;  Surgeon: Rogene Houston, MD;  Location: [facility] END[facility] SUITE;  Service: Endoscopy;  Laterality: N/A;  7:30   CORONARY ANGIOPLASTY  07/2010   BMS to RCA    ENDARTERECTOMY Left 04/14/2020   Procedure: LEFT CAROTID ENDARTERECTOMY;  Surgeon: Rosetta Posner, MD;  Location[facility] OR;  Service: Vascular;  Laterality: Left;   Kidney operation as a child     PATCH ANGIOPLASTY Left 04/14/2020   Procedure: [place];  Surgeon: Rosetta Posner, MD;  Location: [facility] OR;  Service: Vascular;  Laterality: Left;   POLYPECTOMY  12/29/2017   Procedure: POLYPECTOMY;  Surgeon: Rogene Houston, MD;  Location: [facility] SUITE;  Service: Endoscopy;;  sigmoid     Current Outpatient Medications  Medication Sig Dispense Refill   acetaminophen (TYLENOL) 500 MG tablet Take 1,000 mg by mouth every 6 (six) hours as needed for moderate pain or headache.      albuterol (VENTOLIN HFA) 108 (90 Base) MCG/ACT inhaler INHALE 2 PUFFS INTO THE LUNGS EVERY 4 HOURS AS NEEDED FOR WHEEZE OR FOR SHORTNESS OF BREATH 6.7 each 1   amLODipine (NORVASC) 2.5 MG tablet Take 1 tablet (2.5 mg total) by mouth daily. 90 tablet  3   aspirin 81 MG tablet Take 81 mg by mouth daily.       atorvastatin (LIPITOR) 40 MG tablet TAKE 1 TABLET BY MOUTH EVERY DAY 90 tablet 1   losartan (COZAAR) 100 MG tablet Take 1 tablet (100 mg total) by mouth daily. 90 tablet 3   metoprolol tartrate (LOPRESSOR) 25 MG tablet Take 1 tablet (25 mg total) by mouth 2 (two) times daily. 60 tablet 6   nitroGLYCERIN (NITROSTAT) 0.4 MG SL tablet PLACE 1 TABLET UNDER THE TONGUE EVERY 5 MINUTES UP TO 3 DOSES 25 tablet 3   oxyCODONE-acetaminophen (PERCOCET) 5-325 MG tablet Take 1 tablet by mouth every 6 (six) hours as needed for severe pain. 8 tablet 0   omeprazole (PRILOSEC) 20 MG capsule Take 1 capsule (20 mg total) by mouth  daily. 90 capsule 3   No current facility-administered medications for this visit.    Allergies:   Patient has no known allergies.   ROS:  Please see the history of present illness.   Otherwise, review of systems are positive for none.   All other systems are reviewed and negative.    PHYSICAL EXAM: VS:  BP (!) 160/84   Pulse (!) 57   Ht '5\' 9"'$  (1.753 m)   Wt 199 lb (90.3 kg)   BMI 29.39 kg/m  , BMI Body mass index is 29.39 kg/m. GENERAL:  Well appearing NECK:  No jugular venous distention, waveform within normal limits, carotid upstroke brisk and symmetric, no bruits, no thyromegaly LUNGS:  Clear to auscultation bilaterally CHEST:  Unremarkable HEART:  PMI not displaced or sustained,S1 and S2 within normal limits, no S3, no S4, no clicks, no rubs, no murmurs ABD:  Flat, positive bowel sounds normal in frequency in pitch, no bruits, no rebound, no guarding, no midline pulsatile mass, no hepatomegaly, no splenomegaly EXT:  2 plus pulses throughout, no edema, no cyanosis no clubbing    EKG:  EKG is  ordered today. The ekg ordered today demonstrates sinus rhythm, rate 57, axis within normal limits, intervals within normal limits, inferolateral T wave inversions no change from previous.    Recent Labs: 04/07/2020: ALT 16 04/15/2020: BUN 22; Creatinine, Ser 1.31; Hemoglobin 11.2; Platelets 143; Potassium 4.5; Sodium 137    Lipid Panel    Component Value Date/Time   CHOL 156 02/25/2020 1220   TRIG 141 02/25/2020 1220   HDL 37 (L) 02/25/2020 1220   CHOLHDL 4.2 02/25/2020 1220   VLDL 28 02/25/2020 1220   LDLCALC 91 02/25/2020 1220      Wt Readings from Last 3 Encounters:  02/14/21 199 lb (90.3 kg)  05/01/20 192 lb (87.1 kg)  04/14/20 211 lb 10.3 oz (96 kg)      Other studies Reviewed: Additional studies/ records that were reviewed today include: Labs Review of the above records demonstrates:      See elsewhere   ASSESSMENT AND PLAN:  Coronary artery disease:   The  patient has no new sypmtoms.  No further cardiovascular testing is indicated.  We will continue with aggressive risk reduction and meds as listed.  Hyperlipidemia:   LDL was borderline last year.  He has not had any labs drawn and have those results sent.    Essential hypertension:   His BP is elevated.  I am going to add amlodipine 2.5 mg daily.   Carotid stenosis: He is now status post CEA and he will continue with risk reduction.  Tobacco abuse:    We again talked  about the need to stop smoking.  He has cut down to 1 pack a day.    He has not wanted Chantix.  We will continue to try to slow down.   Current medicines are reviewed at length with the patient today.  The patient does not have concerns regarding medicines.  The following changes have been made:    As above  Labs/ tests ordered today include:  None  Orders Placed This Encounter  Procedures   EKG 12-Lead      Disposition:   FU with me in one year.  Ronnell Guadalajara, MD  02/14/2021 1:15 PM    [facility]

## 2021-02-14 ENCOUNTER — Other Ambulatory Visit: Payer: Self-pay

## 2021-02-14 ENCOUNTER — Encounter: Payer: Self-pay | Admitting: Cardiology

## 2021-02-14 ENCOUNTER — Ambulatory Visit (INDEPENDENT_AMBULATORY_CARE_PROVIDER_SITE_OTHER): Payer: BC Managed Care – PPO | Admitting: Cardiology

## 2021-02-14 VITALS — BP 160/84 | HR 57 | Ht 69.0 in | Wt 199.0 lb

## 2021-02-14 DIAGNOSIS — I10 Essential (primary) hypertension: Secondary | ICD-10-CM | POA: Diagnosis not present

## 2021-02-14 DIAGNOSIS — Z720 Tobacco use: Secondary | ICD-10-CM

## 2021-02-14 DIAGNOSIS — I2510 Atherosclerotic heart disease of native coronary artery without angina pectoris: Secondary | ICD-10-CM

## 2021-02-14 DIAGNOSIS — I6522 Occlusion and stenosis of left carotid artery: Secondary | ICD-10-CM | POA: Diagnosis not present

## 2021-02-14 DIAGNOSIS — E785 Hyperlipidemia, unspecified: Secondary | ICD-10-CM | POA: Diagnosis not present

## 2021-02-14 MED ORDER — OMEPRAZOLE 20 MG PO CPDR: 20.0000 mg | DELAYED_RELEASE_CAPSULE | Freq: Every day | ORAL | 3 refills | Status: DC

## 2021-02-14 MED ORDER — AMLODIPINE BESYLATE 2.5 MG PO TABS: 2.5000 mg | ORAL_TABLET | Freq: Every day | ORAL | 3 refills | Status: DC

## 2021-02-14 NOTE — Patient Instructions (Signed)
Medication Instructions:  Please start Amlodipine 2.5 mg one tablet by mouth daily. Continue other medications as listed.  *If you need a refill on your cardiac medications before your next appointment, please call your pharmacy*  Follow-Up: At [facility], you and your health needs are our priority.  As part of our continuing mission to provide you with exceptional heart care, we have created designated Provider Care Teams.  These Care Teams include your primary Cardiologist (physician) and Advanced Practice Providers (APPs -  Physician Assistants and Nurse Practitioners) who all work together to provide you with the care you need, when you need it.  We recommend signing up for the patient portal called "MyChart".  Sign up information is provided on this After Visit Summary.  MyChart is used to connect with patients for Virtual Visits (Telemedicine).  Patients are able to view lab/test results, encounter notes, upcoming appointments, etc.  Non-urgent messages can be sent to your provider as well.   To learn more about what you can do with MyChart, go to [email or web address].    Your next appointment:   1 year(s)  The format for your next appointment:   In Person  Provider:   Minus Breeding, MD  Thank you for choosing [facility]!!

## 2021-03-10 ENCOUNTER — Other Ambulatory Visit: Payer: Self-pay | Admitting: Cardiology

## 2021-04-30 ENCOUNTER — Other Ambulatory Visit: Payer: Self-pay | Admitting: Cardiology

## 2021-05-07 DIAGNOSIS — J449 Chronic obstructive pulmonary disease, unspecified: Secondary | ICD-10-CM | POA: Diagnosis not present

## 2021-05-07 DIAGNOSIS — R062 Wheezing: Secondary | ICD-10-CM | POA: Diagnosis not present

## 2021-05-07 DIAGNOSIS — J209 Acute bronchitis, unspecified: Secondary | ICD-10-CM | POA: Diagnosis not present

## 2021-05-07 DIAGNOSIS — R0602 Shortness of breath: Secondary | ICD-10-CM | POA: Diagnosis not present

## 2021-05-07 DIAGNOSIS — I10 Essential (primary) hypertension: Secondary | ICD-10-CM | POA: Diagnosis not present

## 2021-06-27 DIAGNOSIS — Z1331 Encounter for screening for depression: Secondary | ICD-10-CM | POA: Diagnosis not present

## 2021-06-27 DIAGNOSIS — Z6828 Body mass index (BMI) 28.0-28.9, adult: Secondary | ICD-10-CM | POA: Diagnosis not present

## 2021-06-27 DIAGNOSIS — E663 Overweight: Secondary | ICD-10-CM | POA: Diagnosis not present

## 2021-06-27 DIAGNOSIS — J449 Chronic obstructive pulmonary disease, unspecified: Secondary | ICD-10-CM | POA: Diagnosis not present

## 2021-06-27 DIAGNOSIS — J22 Unspecified acute lower respiratory infection: Secondary | ICD-10-CM | POA: Diagnosis not present

## 2021-07-08 ENCOUNTER — Other Ambulatory Visit: Payer: Self-pay | Admitting: Cardiology

## 2021-07-24 ENCOUNTER — Ambulatory Visit: Payer: BC Managed Care – PPO | Admitting: Pulmonary Disease

## 2021-07-24 ENCOUNTER — Ambulatory Visit (INDEPENDENT_AMBULATORY_CARE_PROVIDER_SITE_OTHER): Payer: BC Managed Care – PPO

## 2021-07-24 ENCOUNTER — Other Ambulatory Visit: Payer: Self-pay

## 2021-07-24 ENCOUNTER — Encounter: Payer: Self-pay | Admitting: Pulmonary Disease

## 2021-07-24 VITALS — BP 170/82 | HR 73 | Temp 98.0°F | Ht 68.0 in | Wt 189.8 lb

## 2021-07-24 DIAGNOSIS — J449 Chronic obstructive pulmonary disease, unspecified: Secondary | ICD-10-CM | POA: Diagnosis not present

## 2021-07-24 DIAGNOSIS — R059 Cough, unspecified: Secondary | ICD-10-CM

## 2021-07-24 DIAGNOSIS — R918 Other nonspecific abnormal finding of lung field: Secondary | ICD-10-CM | POA: Diagnosis not present

## 2021-07-24 DIAGNOSIS — Z720 Tobacco use: Secondary | ICD-10-CM

## 2021-07-24 DIAGNOSIS — J8410 Pulmonary fibrosis, unspecified: Secondary | ICD-10-CM | POA: Diagnosis not present

## 2021-07-24 LAB — CBC WITH DIFFERENTIAL/PLATELET
Basophils Absolute: 0.1 10*3/uL (ref 0.0–0.1)
Basophils Relative: 0.9 % (ref 0.0–3.0)
Eosinophils Absolute: 0.4 10*3/uL (ref 0.0–0.7)
Eosinophils Relative: 5.5 % — ABNORMAL HIGH (ref 0.0–5.0)
HCT: 41.3 % (ref 39.0–52.0)
Hemoglobin: 13.4 g/dL (ref 13.0–17.0)
Lymphocytes Relative: 19.1 % (ref 12.0–46.0)
Lymphs Abs: 1.4 10*3/uL (ref 0.7–4.0)
MCHC: 32.3 g/dL (ref 30.0–36.0)
MCV: 94.5 fl (ref 78.0–100.0)
Monocytes Absolute: 0.7 10*3/uL (ref 0.1–1.0)
Monocytes Relative: 10 % (ref 3.0–12.0)
Neutro Abs: 4.8 10*3/uL (ref 1.4–7.7)
Neutrophils Relative %: 64.5 % (ref 43.0–77.0)
Platelets: 171 10*3/uL (ref 150.0–400.0)
RBC: 4.37 Mil/uL (ref 4.22–5.81)
RDW: 15.1 % (ref 11.5–15.5)
WBC: 7.4 10*3/uL (ref 4.0–10.5)

## 2021-07-24 MED ORDER — NICOTINE 21-14-7 MG/24HR TD KIT: 1.0000 | PACK | Freq: Every day | TRANSDERMAL | 0 refills | Status: DC

## 2021-07-24 MED ORDER — STIOLTO RESPIMAT 2.5-2.5 MCG/ACT IN AERS: 2.0000 | INHALATION_SPRAY | Freq: Every day | RESPIRATORY_TRACT | 5 refills | Status: DC

## 2021-07-24 MED ORDER — STIOLTO RESPIMAT 2.5-2.5 MCG/ACT IN AERS: 2.0000 | INHALATION_SPRAY | Freq: Every day | RESPIRATORY_TRACT | 0 refills | Status: DC

## 2021-07-24 MED ORDER — VARENICLINE TARTRATE 0.5 MG X 11 & 1 MG X 42 PO TBPK: ORAL_TABLET | ORAL | 0 refills | Status: DC

## 2021-07-24 NOTE — Progress Notes (Signed)
Juan Martinez    [id number]    [birth year]  Primary Care Physician:[facility], [facility]  Referring Physician: [place] [place],  [place] [address]  Chief complaint: Consult for COPD  HPI: 66 year old heavy current smoker with complains of chronic cough with white mucus production, dyspnea on exertion for several years.  He is on albuterol inhaler which does not help with his breathing.  Pets: No pets Occupation: Clinical biochemist Exposures: No mold, hot tub, Jacuzzi.  No feather pillows or comforter Smoking history: 110-pack-year smoker.  Continues to smoke 1 pack/day Travel history: No significant travel history Relevant family history: Sister died of lung cancer.  She was a smoker.  Outpatient Encounter Medications as of 07/24/2021  Medication Sig   albuterol (VENTOLIN HFA) 108 (90 Base) MCG/ACT inhaler INHALE 2 PUFFS INTO THE LUNGS EVERY 4 HOURS AS NEEDED FOR WHEEZE OR FOR SHORTNESS OF BREATH   amLODipine (NORVASC) 2.5 MG tablet Take 1 tablet (2.5 mg total) by mouth daily.   aspirin 81 MG tablet Take 81 mg by mouth daily.     losartan (COZAAR) 100 MG tablet TAKE 1 TABLET BY MOUTH EVERY DAY   metoprolol tartrate (LOPRESSOR) 25 MG tablet TAKE 1 TABLET BY MOUTH TWICE A DAY   nitroGLYCERIN (NITROSTAT) 0.4 MG SL tablet PLACE 1 TABLET UNDER THE TONGUE EVERY 5 MINUTES UP TO 3 DOSES   simvastatin (ZOCOR) 40 MG tablet Take 40 mg by mouth daily.   [DISCONTINUED] acetaminophen (TYLENOL) 500 MG tablet Take 1,000 mg by mouth every 6 (six) hours as needed for moderate pain or headache.    [DISCONTINUED] atorvastatin (LIPITOR) 40 MG tablet TAKE 1 TABLET BY MOUTH EVERY DAY   [DISCONTINUED] omeprazole (PRILOSEC) 20 MG capsule Take 1 capsule (20 mg total) by mouth daily.   [DISCONTINUED] oxyCODONE-acetaminophen (PERCOCET) 5-325 MG tablet Take 1 tablet by mouth every 6 (six) hours as needed for severe pain.   No facility-administered  encounter medications on file as of 07/24/2021.    Allergies as of 07/24/2021   (No Known Allergies)    Past Medical History:  Diagnosis Date   CAD (coronary artery disease), native coronary artery 03/2011   NSTEMI of Cx    COPD (chronic obstructive pulmonary disease) (HCC)    Coronary artery disease 07/2010   NSTEMI of RCA inferior   GERD (gastroesophageal reflux disease)    History of kidney stones    Hypertension    MI (myocardial infarction) ([facility])    Pneumonia    walking pneumonia    Past Surgical History:  Procedure Laterality Date   ANKLE FRACTURE SURGERY Right    CARDIAC CATHETERIZATION  03/21/2011   Single vessel occlusive atheroscleotic CAD.  The mid CX is occluded which is a new finding compared to January of 2012. Continued patency fo the stent in the RCA. Moderate LV dysfx with EF of 40-45%.   CAROTID STENT     pt denies, pt has no scar on either side of his neck   COLONOSCOPY N/A 12/29/2017   Procedure: COLONOSCOPY;  Surgeon: Rogene Houston, MD;  [facility];  Service: Endoscopy;  Laterality: N/A;  7:30   CORONARY ANGIOPLASTY  07/2010   BMS to RCA    ENDARTERECTOMY Left 04/14/2020   Procedure: LEFT CAROTID ENDARTERECTOMY;  Surgeon: Rosetta Posner, MD;  Location: [facility];  Service: Vascular;  Laterality: Left;   Kidney operation as a child  PATCH ANGIOPLASTY Left 04/14/2020   Procedure: PATCH ANGIOPLASTY USING HEMASHIELD PLATINUM FINESSE PATCH;  Surgeon: Rosetta Posner, MD;  Location: [facility];  Service: Vascular;  Laterality: Left;   POLYPECTOMY  12/29/2017   Procedure: POLYPECTOMY;  Surgeon: Rogene Houston, MD;  [facility];  Service: Endoscopy;;  sigmoid    Family History  Problem Relation Age of Onset   Heart disease Mother    Colon cancer Mother     Social History   Socioeconomic History   Marital status: Single    Spouse name: Not on file   Number of children: Not on file   Years of education: Not on file   Highest education  level: Not on file  Occupational History   Not on file  Tobacco Use   Smoking status: Every Day    Packs/day: 1.00    Years: 52.00    Pack years: 52.00    Types: Cigarettes   Smokeless tobacco: Never  Vaping Use   Vaping Use: Never used  Substance and Sexual Activity   Alcohol use: No   Drug use: No   Sexual activity: Yes  Other Topics Concern   Not on file  Social History Narrative   Not on file   Social Determinants of Health   Financial Resource Strain: Not on file  Food Insecurity: Not on file  Transportation Needs: Not on file  Physical Activity: Not on file  Stress: Not on file  Social Connections: Not on file  Intimate Partner Violence: Not on file    Review of systems: Review of Systems  Constitutional: Negative for fever and chills.  HENT: Negative.   Eyes: Negative for blurred vision.  Respiratory: as per HPI  Cardiovascular: Negative for chest pain and palpitations.  Gastrointestinal: Negative for vomiting, diarrhea, blood per rectum. Genitourinary: Negative for dysuria, urgency, frequency and hematuria.  Musculoskeletal: Negative for myalgias, back pain and joint pain.  Skin: Negative for itching and rash.  Neurological: Negative for dizziness, tremors, focal weakness, seizures and loss of consciousness.  Endo/Heme/Allergies: Negative for environmental allergies.  Psychiatric/Behavioral: Negative for depression, suicidal ideas and hallucinations.  All other systems reviewed and are negative.  Physical Exam: Blood pressure (!) 170/82, pulse 73, temperature 98 F (36.7 C), temperature source Oral, height 5\' 8"  (1.727 m), weight 189 lb 12.8 oz (86.1 kg), SpO2 99 %. Gen:      No acute distress HEENT:  EOMI, sclera anicteric Neck:     No masses; no thyromegaly Lungs:    Clear to auscultation bilaterally; normal respiratory effort CV:         Regular rate and rhythm; no murmurs Abd:      + bowel sounds; soft, non-tender; no palpable masses, no  distension Ext:    No edema; adequate peripheral perfusion Skin:      Warm and dry; no rash Neuro: alert and oriented x 3 Psych: normal mood and affect  Data Reviewed: Imaging: Chest x-ray 01/13/2017-borderline cardiomegaly with hyperinflation, tiny right pleural effusion.  PFTs:  Labs: CBC 04/15/2020-WBC 13.6, eos 2%, absolute eosinophil count 272  Assessment:  Assessment for COPD Likely has severe COPD, chronic bronchitis given smoking history and symptoms Check CBC differential, IgE and alpha-1 antitrypsin levels and phenotype for baseline assessment Trial stiolto or similar LABA/LAMA inhaler based on insurance coverage If eosinophils are elevated then we may add ICS  Schedule chest x-ray and PFTs  Active smoker We discussed smoking cessation in detail and he is ready to quit.  Prescribed Chantix and nicotine patches.  Reassess at return visit.  Time spent counseling-5 minutes Refer for low-dose screening CT  Plan/Recommendations: CBC, IgE, alpha-1 antitrypsin levels and phenotype Stiolto Chest x-ray, PFTs Smoking cessation Referral for low-dose screening CT  Marshell Garfinkel MD [facility] 07/24/2021, 9:13 AM  CC: [facility]

## 2021-07-24 NOTE — Addendum Note (Signed)
Addended by: Elton Sin on: 07/24/2021 09:43 AM   Modules accepted: Orders

## 2021-07-24 NOTE — Patient Instructions (Signed)
We will start you on an inhaler called anoro or similar class of medication based on what is covered by her insurance company Check baseline labs including CBC differential, IgE, alpha-1 antitrypsin levels and phenotype Chest x-ray today and schedule PFTs Refer for low-dose screening CT of the chest  Will work on smoking cessation.  Prescribe nicotine patches and Chantix Follow-up in 3 months

## 2021-07-31 ENCOUNTER — Telehealth: Payer: Self-pay | Admitting: Pulmonary Disease

## 2021-07-31 MED ORDER — BREZTRI AEROSPHERE 160-9-4.8 MCG/ACT IN AERO: 2.0000 | INHALATION_SPRAY | Freq: Two times a day (BID) | RESPIRATORY_TRACT | 11 refills | Status: DC

## 2021-07-31 NOTE — Telephone Encounter (Signed)
Chest x-ray shows chronic changes and no acute abnormality His labs show elevation in eosinophils indicating he may have asthma in addition to COPD Please change inhaler to Trelegy or breztri based on insurance coverage  Patient is aware of results and voiced his understanding.  He would like to try [facility], Rx sent to preferred pharmacy.  Nothing further needed at this time,.

## 2021-08-02 LAB — IGE: IgE (Immunoglobulin E), Serum: 226 kU/L — ABNORMAL HIGH (ref <=114)

## 2021-08-02 LAB — ALPHA-1 ANTITRYPSIN PHENOTYPE: A-1 Antitrypsin, Ser: 152 mg/dL (ref 83–199)

## 2021-08-14 ENCOUNTER — Other Ambulatory Visit: Payer: Self-pay

## 2021-08-14 DIAGNOSIS — Z87891 Personal history of nicotine dependence: Secondary | ICD-10-CM

## 2021-08-14 DIAGNOSIS — F17210 Nicotine dependence, cigarettes, uncomplicated: Secondary | ICD-10-CM

## 2021-08-20 ENCOUNTER — Other Ambulatory Visit: Payer: Self-pay | Admitting: Pulmonary Disease

## 2021-09-19 ENCOUNTER — Encounter: Payer: Self-pay | Admitting: Acute Care

## 2021-09-19 ENCOUNTER — Other Ambulatory Visit: Payer: Self-pay

## 2021-09-19 ENCOUNTER — Ambulatory Visit (INDEPENDENT_AMBULATORY_CARE_PROVIDER_SITE_OTHER): Payer: BC Managed Care – PPO | Admitting: Acute Care

## 2021-09-19 DIAGNOSIS — F17210 Nicotine dependence, cigarettes, uncomplicated: Secondary | ICD-10-CM

## 2021-09-19 NOTE — Patient Instructions (Signed)
Thank you for participating in the [facility] Cancer Screening Program. °It was our pleasure to meet you today. °We will call you with the results of your scan within the next few days. °Your scan will be assigned a Lung RADS category score by the physicians reading the scans.  °This Lung RADS score determines follow up scanning.  °See below for description of categories, and follow up screening recommendations. °We will be in touch to schedule your follow up screening annually or based on recommendations of our providers. °We will fax a copy of your scan results to your Primary Care Physician, or the physician who referred you to the program, to ensure they have the results. °Please call the office if you have any questions or concerns regarding your scanning experience or results.  °Our office number is [phone]. °Please speak with Denise Phelps, RN. She is our Lung Cancer Screening RN. °If she is unavailable when you call, please have the office staff send her a message. She will return your call at her earliest convenience. °Remember, if your scan is normal, we will scan you annually as long as you continue to meet the criteria for the program. (Age 55-77, Current smoker or smoker who has quit within the last 15 years). °If you are a smoker, remember, quitting is the single most powerful action that you can take to decrease your risk of lung cancer and other pulmonary, breathing related problems. °We know quitting is hard, and we are here to help.  °Please let us know if there is anything we can do to help you meet your goal of quitting. °If you are a former smoker, congratulations. We are proud of you! Remain smoke free! °Remember you can refer friends or family members through the number above.  °We will screen them to make sure they meet criteria for the program. °Thank you for helping us take better care of you by participating in Lung Screening. ° °You can receive free nicotine replacement therapy  ( patches, gum or mints) by calling 1-800-QUIT NOW. Please call so we can get you on the path to becoming  a non-smoker. I know it is hard, but you can do this! ° °Lung RADS Categories: ° °Lung RADS 1: no nodules or definitely non-concerning nodules.  °Recommendation is for a repeat annual scan in 12 months. ° °Lung RADS 2:  nodules that are non-concerning in appearance and behavior with a very low likelihood of becoming an active cancer. °Recommendation is for a repeat annual scan in 12 months. ° °Lung RADS 3: nodules that are probably non-concerning , includes nodules with a low likelihood of becoming an active cancer.  Recommendation is for a 6-month repeat screening scan. Often noted after an upper respiratory illness. We will be in touch to make sure you have no questions, and to schedule your 6-month scan. ° °Lung RADS 4 A: nodules with concerning findings, recommendation is most often for a follow up scan in 3 months or additional testing based on our provider's assessment of the scan. We will be in touch to make sure you have no questions and to schedule the recommended 3 month follow up scan. ° °Lung RADS 4 B:  indicates findings that are concerning. We will be in touch with you to schedule additional diagnostic testing based on our provider's  assessment of the scan. ° °Hypnosis for smoking cessation  °[employer] °[phone] ° °Acupuncture for smoking cessation  °[facility] °[phone]  °

## 2021-09-19 NOTE — Progress Notes (Signed)
Virtual Visit via Telephone Note ? ?I connected with Juan Martinez on 09/19/21 at  9:00 AM EDT by telephone and verified that I am speaking with the correct person using two identifiers. ? ?Location: ?Patient:  At home ?Provider: [place], [place], [place], [address]  ?  ?I discussed the limitations, risks, security and privacy concerns of performing an evaluation and management service by telephone and the availability of in person appointments. I also discussed with the patient that there may be a patient responsible charge related to this service. The patient expressed understanding and agreed to proceed. ? ? ? ?Shared Decision Making Visit Lung Cancer Screening Program ?[phone]) ? ? ?Eligibility: ?Age 66 y.o. ?Pack Years Smoking History Calculation 82 pack years ?(# packs/per year x # years smoked) ?Recent History of coughing up blood  no ?Unexplained weight loss? no ?( >Than 15 pounds within the last 6 months ) ?Prior History Lung / other cancer no ?(Diagnosis within the last 5 years already requiring surveillance chest CT Scans). ?Smoking Status Current Smoker ?Former Smokers: Years since quit:  NA ? Quit Date:  NA ? ?Visit Components: ?Discussion included one or more decision making aids. yes ?Discussion included risk/benefits of screening. yes ?Discussion included potential follow up diagnostic testing for abnormal scans. yes ?Discussion included meaning and risk of over diagnosis. yes ?Discussion included meaning and risk of False Positives. yes ?Discussion included meaning of total radiation exposure. yes ? ?Counseling Included: ?Importance of adherence to annual lung cancer LDCT screening. yes ?Impact of comorbidities on ability to participate in the program. yes ?Ability and willingness to under diagnostic treatment. yes ? ?Smoking Cessation Counseling: ?Current Smokers:  ?Discussed importance of smoking cessation. yes ?Information about tobacco cessation classes and interventions provided to  patient. yes ?Patient provided with "ticket" for LDCT Scan. yes ?Symptomatic Patient. no ? Counseling NA ?Diagnosis Code: Tobacco Use Z72.0 ?Asymptomatic Patient yes ? Counseling (Intermediate counseling: > three minutes counseling) U0454 ?Former Smokers:  ?Discussed the importance of maintaining cigarette abstinence. yes ?Diagnosis Code: Personal History of Nicotine Dependence. U98.119 ?Information about tobacco cessation classes and interventions provided to patient. Yes ?Patient provided with "ticket" for LDCT Scan. yes ?Written Order for Lung Cancer Screening with LDCT placed in Epic. Yes ?(CT Chest Lung Cancer Screening Low Dose W/O CM) [id number] ?Z12.2-Screening of respiratory organs ?Z87.891-Personal history of nicotine dependence ? ?I have spent 25 minutes of face to face/ virtual visit   time with  Juan Martinez discussing the risks and benefits of lung cancer screening. We viewed / discussed a power point together that explained in detail the above noted topics. We paused at intervals to allow for questions to be asked and answered to ensure understanding.We discussed that the single most powerful action that he can take to decrease his risk of developing lung cancer is to quit smoking. We discussed whether or not he is ready to commit to setting a quit date. We discussed options for tools to aid in quitting smoking including nicotine replacement therapy, non-nicotine medications, support groups, Quit Smart classes, and behavior modification. We discussed that often times setting smaller, more achievable goals, such as eliminating 1 cigarette a day for a week and then 2 cigarettes a day for a week can be helpful in slowly decreasing the number of cigarettes smoked. This allows for a sense of accomplishment as well as providing a clinical benefit. I provided  him  with smoking cessation  information  with contact information for community resources, classes, free  nicotine replacement therapy, and access to  mobile apps, text messaging, and on-line smoking cessation help. I have also provided  him  the office contact information in the event he needs to contact me, or the screening staff. We discussed the time and location of the scan, and that either Doroteo Glassman RN, Joella Prince, RN  or I will call / send a letter with the results within 24-72 hours of receiving them. The patient verbalized understanding of all of  the above and had no further questions upon leaving the office. They have my contact information in the event they have any further questions. ? ?I spent 3 minutes counseling on smoking cessation and the health risks of continued tobacco abuse. ? ?I explained to the patient that there has been a high incidence of coronary artery disease noted on these exams. I explained that this is a non-gated exam therefore degree or severity cannot be determined. This patient is on statin therapy. I have asked the patient to follow-up with their PCP regarding any incidental finding of coronary artery disease and management with diet or medication as their PCP  feels is clinically indicated. The patient verbalized understanding of the above and had no further questions upon completion of the visit. ? ?  ? ? ?Magdalen Spatz, NP ?09/19/2021 ? ? ? ? ? ? ?

## 2021-09-20 ENCOUNTER — Ambulatory Visit (HOSPITAL_COMMUNITY)
Admission: RE | Admit: 2021-09-20 | Discharge: 2021-09-20 | Disposition: A | Discharge status: Home / Self Care | Payer: BC Managed Care – PPO | Source: Ambulatory Visit | Attending: Acute Care | Admitting: Acute Care

## 2021-09-20 ENCOUNTER — Other Ambulatory Visit: Payer: Self-pay

## 2021-09-20 DIAGNOSIS — F17210 Nicotine dependence, cigarettes, uncomplicated: Secondary | ICD-10-CM | POA: Diagnosis not present

## 2021-09-20 DIAGNOSIS — Z87891 Personal history of nicotine dependence: Secondary | ICD-10-CM | POA: Insufficient documentation

## 2021-09-24 ENCOUNTER — Telehealth: Payer: Self-pay | Admitting: Acute Care

## 2021-09-24 ENCOUNTER — Other Ambulatory Visit: Payer: Self-pay

## 2021-09-24 ENCOUNTER — Encounter: Payer: Self-pay | Admitting: Cardiology

## 2021-09-24 DIAGNOSIS — R911 Solitary pulmonary nodule: Secondary | ICD-10-CM

## 2021-09-24 DIAGNOSIS — F17210 Nicotine dependence, cigarettes, uncomplicated: Secondary | ICD-10-CM

## 2021-09-24 DIAGNOSIS — Z87891 Personal history of nicotine dependence: Secondary | ICD-10-CM

## 2021-09-24 NOTE — Telephone Encounter (Signed)
Contacted patient by phone to review results of LDCT.  Recommendation to repeat CT in 6 months to monitor lung nodule in shorter time frame than yearly scan.  This is a precautionary measure that is often recommended.  Also noted is atherosclerosis and emphysema, as previous and a dilated thoracic aorta.  Patient sees cardiology and had a work up a couple of years ago.  He is also aware of kidney stones, which were noted in report.  Patient acknowledged understanding of information and is in agreement to do follow up scan in 6 months. Order for 6 months CT Chest placed and results routed to PCP and cardiology for review/recommendations.   ?

## 2021-09-25 MED ORDER — AMLODIPINE BESYLATE 2.5 MG PO TABS: 5.0000 mg | ORAL_TABLET | Freq: Every day | ORAL | 0 refills | Status: DC

## 2021-10-01 ENCOUNTER — Other Ambulatory Visit: Payer: Self-pay | Admitting: Cardiology

## 2021-10-01 ENCOUNTER — Other Ambulatory Visit: Payer: Self-pay | Admitting: Pulmonary Disease

## 2021-10-01 MED ORDER — AMLODIPINE BESYLATE 5 MG PO TABS: 5.0000 mg | ORAL_TABLET | Freq: Every day | ORAL | 3 refills | Status: DC

## 2021-10-08 ENCOUNTER — Other Ambulatory Visit: Payer: Self-pay | Admitting: Pulmonary Disease

## 2021-10-08 NOTE — Telephone Encounter (Signed)
Pt just had a shared decision visit with Judson Roch. Routing this to her to see if she is okay with [place] sending chantix Rx to the pharmacy for pt since Dr. Vaughan Browner is currently out of the office. ?

## 2021-10-10 NOTE — Telephone Encounter (Signed)
OK to send in starter pack of Chantix, needs to follow up with Dr. Vaughan Browner as scheduled 4/21 to assess tolerance. Any adverse reactions, he needs to stop medication immediately and call the office.  ?Order has been sent, but please call him with above instructions. Thanks ?

## 2021-10-19 ENCOUNTER — Ambulatory Visit: Payer: BC Managed Care – PPO | Admitting: Pulmonary Disease

## 2021-10-30 ENCOUNTER — Other Ambulatory Visit: Payer: Self-pay | Admitting: *Deleted

## 2021-10-30 ENCOUNTER — Encounter: Payer: Self-pay | Admitting: *Deleted

## 2021-10-30 MED ORDER — AMLODIPINE BESYLATE 10 MG PO TABS: 10.0000 mg | ORAL_TABLET | Freq: Every day | ORAL | 3 refills | Status: DC

## 2021-12-19 ENCOUNTER — Encounter: Payer: Self-pay | Admitting: Pulmonary Disease

## 2021-12-19 ENCOUNTER — Ambulatory Visit: Payer: BC Managed Care – PPO | Admitting: Pulmonary Disease

## 2021-12-19 ENCOUNTER — Ambulatory Visit (INDEPENDENT_AMBULATORY_CARE_PROVIDER_SITE_OTHER): Payer: BC Managed Care – PPO | Admitting: Pulmonary Disease

## 2021-12-19 VITALS — BP 144/68 | HR 64 | Temp 97.6°F | Ht 68.0 in | Wt 203.0 lb

## 2021-12-19 DIAGNOSIS — J449 Chronic obstructive pulmonary disease, unspecified: Secondary | ICD-10-CM

## 2021-12-19 DIAGNOSIS — R059 Cough, unspecified: Secondary | ICD-10-CM

## 2021-12-19 DIAGNOSIS — F17210 Nicotine dependence, cigarettes, uncomplicated: Secondary | ICD-10-CM | POA: Diagnosis not present

## 2021-12-19 LAB — PULMONARY FUNCTION TEST
DL/VA % pred: 99 %
DL/VA: 4.12 ml/min/mmHg/L
DLCO cor % pred: 90 %
DLCO cor: 22.34 ml/min/mmHg
DLCO unc % pred: 90 %
DLCO unc: 22.34 ml/min/mmHg
FEF 25-75 Post: 1.74 L/sec
FEF 25-75 Pre: 0.62 L/sec
FEF2575-%Change-Post: 182 %
FEF2575-%Pred-Post: 72 %
FEF2575-%Pred-Pre: 25 %
FEV1-%Change-Post: 31 %
FEV1-%Pred-Post: 63 %
FEV1-%Pred-Pre: 48 %
FEV1-Post: 1.96 L
FEV1-Pre: 1.49 L
FEV1FVC-%Change-Post: 11 %
FEV1FVC-%Pred-Pre: 82 %
FEV6-%Change-Post: 19 %
FEV6-%Pred-Post: 71 %
FEV6-%Pred-Pre: 60 %
FEV6-Post: 2.82 L
FEV6-Pre: 2.36 L
FEV6FVC-%Change-Post: 1 %
FEV6FVC-%Pred-Post: 104 %
FEV6FVC-%Pred-Pre: 102 %
FVC-%Change-Post: 17 %
FVC-%Pred-Post: 68 %
FVC-%Pred-Pre: 58 %
FVC-Post: 2.86 L
FVC-Pre: 2.44 L
Post FEV1/FVC ratio: 68 %
Post FEV6/FVC ratio: 99 %
Pre FEV1/FVC ratio: 61 %
Pre FEV6/FVC Ratio: 97 %
RV % pred: 159 %
RV: 3.66 L
TLC % pred: 99 %
TLC: 6.56 L

## 2021-12-19 MED ORDER — NICOTINE 21 MG/24HR TD PT24: 21.0000 mg | MEDICATED_PATCH | Freq: Every day | TRANSDERMAL | 1 refills | Status: DC

## 2021-12-19 NOTE — Patient Instructions (Signed)
I have reviewed your labs and lung function test which show a combination of COPD and asthma Continue the breztri inhaler.  Take 2 puffs twice daily Continue to work on smoking cessation.  We will see you resend an order of nicotine patches Follow-up in 6 months.

## 2021-12-19 NOTE — Progress Notes (Signed)
Juan Martinez    [id number]    1955-08-03  Primary Care Physician:[facility]  Referring Physician: [place] [place] [place],  [place] [address]  Chief complaint: Consult for COPD  HPI: 66 year old heavy current smoker with complains of chronic cough with white mucus production, dyspnea on exertion for several years.  He is on albuterol inhaler which does not help with his breathing.  Pets: No pets Occupation: Clinical biochemist Exposures: No mold, hot tub, Jacuzzi.  No feather pillows or comforter Smoking history: 110-pack-year smoker.  Continues to smoke 1 pack/day Travel history: No significant travel history Relevant family history: Sister died of lung cancer.  She was a smoker.  Outpatient Encounter Medications as of 12/19/2021  Medication Sig   albuterol (VENTOLIN HFA) 108 (90 Base) MCG/ACT inhaler INHALE 2 PUFFS INTO THE LUNGS EVERY 4 HOURS AS NEEDED FOR WHEEZE OR FOR SHORTNESS OF BREATH   amLODipine (NORVASC) 10 MG tablet Take 1 tablet (10 mg total) by mouth daily.   aspirin 81 MG tablet Take 81 mg by mouth daily.     Budeson-Glycopyrrol-Formoterol (BREZTRI AEROSPHERE) 160-9-4.8 MCG/ACT AERO Inhale 2 puffs into the lungs in the morning and at bedtime.   losartan (COZAAR) 100 MG tablet TAKE 1 TABLET BY MOUTH EVERY DAY   metoprolol tartrate (LOPRESSOR) 25 MG tablet TAKE 1 TABLET BY MOUTH TWICE A DAY   Nicotine 21-14-7 MG/24HR KIT Place 1 patch onto the skin daily.   nitroGLYCERIN (NITROSTAT) 0.4 MG SL tablet PLACE 1 TABLET UNDER THE TONGUE EVERY 5 MINUTES UP TO 3 DOSES   simvastatin (ZOCOR) 40 MG tablet Take 40 mg by mouth daily.   varenicline (CHANTIX PAK) 0.5 MG X 11 & 1 MG X 42 tablet TAKE ONE 0.5 MG TABLET BY MOUTH ONCE DAILY FOR 3 DAYS, THEN INCREASE TO ONE 0.5 MG TABLET TWICE DAILY FOR 4 DAYS, THEN INCREASE TO ONE 1 MG TABLET TWICE DAILY. (Patient not taking: Reported on 12/19/2021)   No facility-administered  encounter medications on file as of 12/19/2021.   Physical Exam: Blood pressure (!) 170/82, pulse 73, temperature 98 F (36.7 C), temperature source Oral, height _0  (1.727 m), weight 189 lb 12.8 oz (86.1 kg), SpO2 99 %. Gen:      No acute distress HEENT:  EOMI, sclera anicteric Neck:     No masses; no thyromegaly Lungs:    Clear to auscultation bilaterally; normal respiratory effort CV:         Regular rate and rhythm; no murmurs Abd:      + bowel sounds; soft, non-tender; no palpable masses, no distension Ext:    No edema; adequate peripheral perfusion Skin:      Warm and dry; no rash Neuro: alert and oriented x 3 Psych: normal mood and affect  Data Reviewed: Imaging: Chest x-ray 01/13/2017-borderline cardiomegaly with hyperinflation, tiny right pleural effusion. Chest x-ray 07/24/2021-mild chronic interstitial marking Screening CT chest 09/20/2021-mild emphysema, scattered pulmonary nodules with the largest 7.8 mm in the right lower lobe.  PFTs: 12/19/2021 FVC 2.86 [68%], FEV1 1.96 [63%], TLC 6.56 [99%], DLCO 22.34 [90%] Moderate obstruction with bronchodilator response and air trapping  Labs: CBC 04/15/2020-WBC 13.6, eos 2%, absolute eosinophil count 272 CBC 07/24/2021-WBC 7.4, eos 5.5%, absolute eosinophil count 407  IgE 07/24/2021-226 Alpha-1 antitrypsin levels and phenotype 07/24/21- 152, PI SZ  Assessment:  COPD, asthma overlap Has a combination of COPD and asthma with elevated IgE, peripheral eosinophils and  significant bronchodilator response on PFTs Currently on breztri inhaler with good response.  He is using the inhaler only once a day and I have instructed him to use 2 puffs twice daily.  Active smoker We discussed smoking cessation in detail and he is ready to quit.  Prescribed Chantix and nicotine patches.  Reassess at return visit.  Time spent counseling-5 minutes  Lung nodule Noted on low-dose screening CT.  Follow-up has been ordered for 6 months  for  Plan/Recommendations: Continue breztri.  Use twice daily Smoking cessation Continue low-dose screening CT  Marshell Garfinkel MD [facility] 12/19/2021, 10:56 AM  CC: [facility], [facility]

## 2021-12-19 NOTE — Progress Notes (Signed)
PFT done today. 

## 2022-01-27 ENCOUNTER — Other Ambulatory Visit: Payer: Self-pay | Admitting: Pulmonary Disease

## 2022-01-27 DIAGNOSIS — S46219A Strain of muscle, fascia and tendon of other parts of biceps, unspecified arm, initial encounter: Secondary | ICD-10-CM | POA: Diagnosis not present

## 2022-02-05 ENCOUNTER — Encounter: Payer: Self-pay | Admitting: Pulmonary Disease

## 2022-02-08 DIAGNOSIS — S46211A Strain of muscle, fascia and tendon of other parts of biceps, right arm, initial encounter: Secondary | ICD-10-CM | POA: Diagnosis not present

## 2022-03-25 ENCOUNTER — Encounter: Payer: Self-pay | Admitting: Pulmonary Disease

## 2022-03-27 ENCOUNTER — Other Ambulatory Visit: Payer: Self-pay

## 2022-03-27 DIAGNOSIS — Z87891 Personal history of nicotine dependence: Secondary | ICD-10-CM

## 2022-03-27 DIAGNOSIS — F17210 Nicotine dependence, cigarettes, uncomplicated: Secondary | ICD-10-CM

## 2022-03-27 DIAGNOSIS — Z122 Encounter for screening for malignant neoplasm of respiratory organs: Secondary | ICD-10-CM

## 2022-03-29 ENCOUNTER — Other Ambulatory Visit: Payer: Self-pay | Admitting: Cardiology

## 2022-03-29 ENCOUNTER — Ambulatory Visit (HOSPITAL_COMMUNITY): Payer: BC Managed Care – PPO

## 2022-04-07 ENCOUNTER — Other Ambulatory Visit: Payer: Self-pay | Admitting: Cardiology

## 2022-04-09 ENCOUNTER — Other Ambulatory Visit: Payer: Self-pay | Admitting: Pulmonary Disease

## 2022-04-09 NOTE — Telephone Encounter (Signed)
Called and spoke with patient.  Patient stated nicotine patches is not helping with smoking cessation.  Patient stated he has nicotine patches left and does not need a refill.  Patient stated he is using Chantix and thinks Chantix may be helping with some nicotine cravings.  Advised patient to call if he needs Chantix refill.

## 2022-04-27 ENCOUNTER — Other Ambulatory Visit: Payer: Self-pay | Admitting: Cardiology

## 2022-05-13 ENCOUNTER — Other Ambulatory Visit: Payer: Self-pay | Admitting: Pulmonary Disease

## 2022-05-13 NOTE — Telephone Encounter (Signed)
Please advise on med refill. 

## 2022-06-12 ENCOUNTER — Telehealth: Payer: Self-pay | Admitting: *Deleted

## 2022-06-12 NOTE — Telephone Encounter (Signed)
Spoke to pt regarding follow up lung screening that was scheduled at [facility] back in 03/2022. Pt stated that he didn't go to have it done. I gave pt the number to the [facility] facility to call them back to get rescheduled. Pt stated that he will give them a call.

## 2022-06-18 ENCOUNTER — Encounter: Payer: Self-pay | Admitting: Physician Assistant

## 2022-06-18 ENCOUNTER — Ambulatory Visit: Payer: BC Managed Care – PPO | Attending: Physician Assistant | Admitting: Physician Assistant

## 2022-06-18 ENCOUNTER — Ambulatory Visit: Payer: BC Managed Care – PPO | Admitting: Nurse Practitioner

## 2022-06-18 VITALS — BP 142/78 | HR 64 | Ht 69.0 in | Wt 201.4 lb

## 2022-06-18 DIAGNOSIS — I10 Essential (primary) hypertension: Secondary | ICD-10-CM

## 2022-06-18 DIAGNOSIS — Z720 Tobacco use: Secondary | ICD-10-CM | POA: Diagnosis not present

## 2022-06-18 DIAGNOSIS — I340 Nonrheumatic mitral (valve) insufficiency: Secondary | ICD-10-CM

## 2022-06-18 DIAGNOSIS — I2510 Atherosclerotic heart disease of native coronary artery without angina pectoris: Secondary | ICD-10-CM | POA: Diagnosis not present

## 2022-06-18 DIAGNOSIS — E785 Hyperlipidemia, unspecified: Secondary | ICD-10-CM

## 2022-06-18 DIAGNOSIS — I6529 Occlusion and stenosis of unspecified carotid artery: Secondary | ICD-10-CM

## 2022-06-18 MED ORDER — IRBESARTAN 300 MG PO TABS: 300.0000 mg | ORAL_TABLET | Freq: Every day | ORAL | 3 refills | Status: DC

## 2022-06-18 NOTE — Progress Notes (Signed)
Office Visit    Patient Name: Juan Martinez Date of Encounter: 06/18/2022  PCP:  [facility]ll[facility], [facility]  Cardiologist:  Minus Breeding, MD  Advanced Practice Provider:  No care team member to display Electrophysiologist:  None   HPI    BLAYZE HAEN is a 66 y.o. male with a PMH of NSTEMI with 100% occlusion of the midcircumflex after the OM1 presents today for annual follow-up appointment.  He had a nuclear stress test 8/18 which showed a large inferior wall infarct from the apex and no evidence of ischemia, LVEF 42%. Echo showed mildly reduced left ventricular systolic function, LVEF 20-94%, mid and basal inferior wall akinesis, and mild mitral regurgitation in 2018.    He had a CEA. He was not having any cardiovascular complaints at his last visit 8/22. He was working actively in heating and conditioning. He denied chest pain and SOB. No reported palpitations, syncope, or presyncope at that time.   Today, he tells me he does not take his statin anymore. He says he didn't have any side effects he just decided he didn't need it. We will plan to order labs today. He has had some trouble sleeping and was struggling with a URI. He still has a cough and some nasal drainage. He works 10 hour days usually 4 days a week but up to 6 days a week. He is being monitored by his PCP for a lung nodule and he is due for a repeat CT scan (every 6 months). He knows to schedule this.   Reports no shortness of breath nor dyspnea on exertion. Reports no chest pain, pressure, or tightness. No edema, orthopnea, PND. Reports no palpitations.  Past Medical History    Past Medical History:  Diagnosis Date   CAD (coronary artery disease), native coronary artery 03/2011   NSTEMI of Cx    COPD (chronic obstructive pulmonary disease) (HCC)    Coronary artery disease 07/2010   NSTEMI of RCA inferior   GERD (gastroesophageal reflux disease)    History of kidney  stones    Hypertension    MI (myocardial infarction) ([place])    Pneumonia    walking pneumonia   Past Surgical History:  Procedure Laterality Date   ANKLE FRACTURE SURGERY Right    CARDIAC CATHETERIZATION  03/21/2011   Single vessel occlusive atheroscleotic CAD.  The mid CX is occluded which is a new finding compared to January of 2012. Continued patency fo the stent in the RCA. Moderate LV dysfx with EF of 40-45%.   CAROTID STENT     pt denies, pt has no scar on either side of his neck   COLONOSCOPY N/A 12/29/2017   Procedure: COLONOSCOPY;  Surgeon: Rogene Houston, MD;  [facility];  Service: Endoscopy;  Laterality: N/A;  7:30   CORONARY ANGIOPLASTY  07/2010   BMS to RCA    ENDARTERECTOMY Left 04/14/2020   Procedure: LEFT CAROTID ENDARTERECTOMY;  Surgeon: Rosetta Posner, MD;  Location: [facility] OR;  Service: Vascular;  Laterality: Left;   Kidney operation as a child     PATCH ANGIOPLASTY Left 04/14/2020   Procedure: [place];  Surgeon: Rosetta Posner, MD;  Location: [facility] OR;  Service: Vascular;  Laterality: Left;   POLYPECTOMY  12/29/2017   Procedure: POLYPECTOMY;  Surgeon: Rogene Houston, MD;  Location: [facility];  Service: Endoscopy;;  sigmoid    Allergies  No Known Allergies   EKGs/Labs/Other Studies Reviewed:   The following studies were reviewed today:  Carotid US 03/02/20   Summary:  Right Carotid: Velocities in the right ICA are consistent with a 40-59%                 stenosis.                 The ECA appears >50% stenosed.   Left Carotid: Velocities in the left ICA are consistent with a 80-99%  stenosis.   Vertebrals: Bilateral vertebral arteries demonstrate antegrade flow.  Subclavians: Normal flow hemodynamics were seen in bilateral subclavian               arteries.    EKG:  EKG is  ordered today.  The ekg ordered today demonstrates NSR 60 bpm, LVH  Recent Labs: 07/24/2021: Hemoglobin 13.4; Platelets  171.0  Recent Lipid Panel    Component Value Date/Time   CHOL 156 02/25/2020 1220   TRIG 141 02/25/2020 1220   HDL 37 (L) 02/25/2020 1220   CHOLHDL 4.2 02/25/2020 1220   VLDL 28 02/25/2020 1220   LDLCALC 91 02/25/2020 1220    Home Medications   Current Meds  Medication Sig   irbesartan (AVAPRO) 300 MG tablet Take 1 tablet (300 mg total) by mouth daily.     Review of Systems      All other systems reviewed and are otherwise negative except as noted above.  Physical Exam    VS:  BP (!) 142/78   Pulse 64   Ht '5\' 9"'$  (1.753 m)   Wt 201 lb 6.4 oz (91.4 kg)   SpO2 98%   BMI 29.74 kg/m  , BMI Body mass index is 29.74 kg/m.  Wt Readings from Last 3 Encounters:  06/18/22 201 lb 6.4 oz (91.4 kg)  12/19/21 203 lb (92.1 kg)  07/24/21 189 lb 12.8 oz (86.1 kg)     GEN: Well nourished, well developed, in no acute distress. HEENT: normal. Neck: Supple, no JVD, carotid bruits, or masses. Cardiac: RRR, + systolic  murmur, rubs, or gallops. No clubbing, cyanosis, edema.  Radials/PT 2+ and equal bilaterally.  Respiratory:  Respirations regular and unlabored, clear to auscultation bilaterally. GI: Soft, nontender, nondistended. MS: No deformity or atrophy. Skin: Warm and dry, no rash. Neuro:  Strength and sensation are intact. Psych: Normal affect.  Assessment & Plan    CAD -no chest pains no SOB -Continue current medication regimen which includes aspirin 81 mg, Norvasc '10mg'$  + 2.'5mg'$ , Lipitor '40mg'$  daily, Losartan '100mg'$  daily, nitrol as needed  Hyperlipidemia -Lipid panel and LFTs ordered today  Hypertension -track BP at home for two weeks -We changed his losartan to irbesartan '300mg'$  daily   Carotid stenosis -Aug he has CEA 2021 -US of the neck ordered today  Tobacco abuse -cessation encouraged -he has nicotine patches but he was afraid to use them  Systolic murmur -we will update an echo today     Disposition: Follow up 6 month with Minus Breeding, MD or  [facility].  Signed, Elgie Collard, PA-C 06/18/2022, 4:53 PM [facility]

## 2022-06-18 NOTE — Patient Instructions (Signed)
Medication Instructions:  1.Stop losartan 2.Start irbesartan 300 mg daily *If you need a refill on your cardiac medications before your next appointment, please call your pharmacy*   Lab Work: None If you have labs (blood work) drawn today and your tests are completely normal, you will receive your results only by: [place] (if you have MyChart) OR A paper copy in the mail If you have any lab test that is abnormal or we need to change your treatment, we will call you to review the results.   Testing/Procedures: Your physician has requested that you have a carotid duplex. This test is an ultrasound of the carotid arteries in your neck. It looks at blood flow through these arteries that supply the brain with blood. Allow one hour for this exam. There are no restrictions or special instructions.   Your physician has requested that you have an echocardiogram. Echocardiography is a painless test that uses sound waves to create images of your heart. It provides your doctor with information about the size and shape of your heart and how well your heart's chambers and valves are working. This procedure takes approximately one hour. There are no restrictions for this procedure. Please do NOT wear cologne, perfume, aftershave, or lotions (deodorant is allowed). Please arrive 15 minutes prior to your appointment time.    Follow-Up: At [facility], you and your health needs are our priority.  As part of our continuing mission to provide you with exceptional heart care, we have created designated Provider Care Teams.  These Care Teams include your primary Cardiologist (physician) and Advanced Practice Providers (APPs -  Physician Assistants and Nurse Practitioners) who all work together to provide you with the care you need, when you need it.   Your next appointment:   6 month(s)  The format for your next appointment:   In Person  Provider:   Minus Breeding, MD    Important  Information About Sugar

## 2022-06-19 ENCOUNTER — Ambulatory Visit: Payer: BC Managed Care – PPO | Admitting: Nurse Practitioner

## 2022-06-27 ENCOUNTER — Encounter: Payer: Self-pay | Admitting: Pulmonary Disease

## 2022-06-27 NOTE — Telephone Encounter (Signed)
Mychart message sent by pt: [facility] (supporting Praveen Mannam, MD)40 minutes ago (3:17 PM)    I wanted to see if I could schedule my second lung scan please     Routing to lung nodule pool.

## 2022-07-01 ENCOUNTER — Other Ambulatory Visit: Payer: Self-pay | Admitting: Cardiology

## 2022-07-02 ENCOUNTER — Encounter: Payer: Self-pay | Admitting: Pulmonary Disease

## 2022-07-02 ENCOUNTER — Other Ambulatory Visit: Payer: Self-pay

## 2022-07-02 MED ORDER — BREZTRI AEROSPHERE 160-9-4.8 MCG/ACT IN AERO: 2.0000 | INHALATION_SPRAY | Freq: Two times a day (BID) | RESPIRATORY_TRACT | 11 refills | Status: DC

## 2022-07-02 MED ORDER — ALBUTEROL SULFATE HFA 108 (90 BASE) MCG/ACT IN AERS: INHALATION_SPRAY | RESPIRATORY_TRACT | 1 refills | Status: DC

## 2022-07-04 ENCOUNTER — Ambulatory Visit (INDEPENDENT_AMBULATORY_CARE_PROVIDER_SITE_OTHER): Payer: BC Managed Care – PPO

## 2022-07-04 ENCOUNTER — Ambulatory Visit: Payer: BC Managed Care – PPO | Attending: Physician Assistant

## 2022-07-04 DIAGNOSIS — I340 Nonrheumatic mitral (valve) insufficiency: Secondary | ICD-10-CM | POA: Diagnosis not present

## 2022-07-04 DIAGNOSIS — I6529 Occlusion and stenosis of unspecified carotid artery: Secondary | ICD-10-CM

## 2022-07-04 DIAGNOSIS — I6521 Occlusion and stenosis of right carotid artery: Secondary | ICD-10-CM | POA: Diagnosis not present

## 2022-07-04 LAB — ECHOCARDIOGRAM COMPLETE
AR max vel: 2.08 cm2
AV Peak grad: 11.6 mmHg
Ao pk vel: 1.71 m/s
Area-P 1/2: 2.79 cm2
Calc EF: 36.2 %
MV M vel: 4.66 m/s
MV Peak grad: 86.9 mmHg
P 1/2 time: 2284 msec
Radius: 0.3 cm
S' Lateral: 5.3 cm
Single Plane A2C EF: 43 %
Single Plane A4C EF: 29.2 %

## 2022-07-12 DIAGNOSIS — Z6830 Body mass index (BMI) 30.0-30.9, adult: Secondary | ICD-10-CM | POA: Diagnosis not present

## 2022-07-12 DIAGNOSIS — M25551 Pain in right hip: Secondary | ICD-10-CM | POA: Diagnosis not present

## 2022-07-12 DIAGNOSIS — I10 Essential (primary) hypertension: Secondary | ICD-10-CM | POA: Diagnosis not present

## 2022-07-12 DIAGNOSIS — E6609 Other obesity due to excess calories: Secondary | ICD-10-CM | POA: Diagnosis not present

## 2022-07-26 DIAGNOSIS — S46211D Strain of muscle, fascia and tendon of other parts of biceps, right arm, subsequent encounter: Secondary | ICD-10-CM | POA: Diagnosis not present

## 2022-07-26 DIAGNOSIS — M5431 Sciatica, right side: Secondary | ICD-10-CM | POA: Diagnosis not present

## 2022-07-28 ENCOUNTER — Other Ambulatory Visit: Payer: Self-pay | Admitting: Cardiology

## 2022-07-30 ENCOUNTER — Other Ambulatory Visit: Payer: Self-pay | Admitting: Cardiology

## 2022-08-09 DIAGNOSIS — M5431 Sciatica, right side: Secondary | ICD-10-CM | POA: Diagnosis not present

## 2022-08-12 ENCOUNTER — Other Ambulatory Visit: Payer: Self-pay | Admitting: Pulmonary Disease

## 2022-08-17 ENCOUNTER — Other Ambulatory Visit: Payer: Self-pay | Admitting: Pulmonary Disease

## 2022-08-26 ENCOUNTER — Other Ambulatory Visit: Payer: Self-pay | Admitting: Cardiology

## 2022-08-28 ENCOUNTER — Encounter: Payer: Self-pay | Admitting: Cardiology

## 2022-08-28 ENCOUNTER — Telehealth: Payer: Self-pay | Admitting: Cardiology

## 2022-08-28 NOTE — Telephone Encounter (Signed)
Attempted to call patient, left message for patient to call back to office.   

## 2022-08-28 NOTE — Telephone Encounter (Signed)
  Pt said, he missed a call from [place], he said, its probably about his medication refill he requested.

## 2022-08-30 DIAGNOSIS — M5431 Sciatica, right side: Secondary | ICD-10-CM | POA: Diagnosis not present

## 2022-09-06 NOTE — Telephone Encounter (Signed)
Attempted to call patient, left message for patient to call back to office.   

## 2022-09-06 NOTE — Telephone Encounter (Signed)
Patient is returning call.  °

## 2022-09-06 NOTE — Telephone Encounter (Signed)
Attempted to call patient, left message for patient to call back to office.  Advised that if needed please call back- call back number left.

## 2022-09-21 ENCOUNTER — Other Ambulatory Visit: Payer: Self-pay | Admitting: Cardiology

## 2022-09-26 ENCOUNTER — Other Ambulatory Visit: Payer: Self-pay | Admitting: Cardiology

## 2022-10-05 ENCOUNTER — Other Ambulatory Visit: Payer: Self-pay | Admitting: Cardiology

## 2022-10-28 ENCOUNTER — Other Ambulatory Visit: Payer: Self-pay | Admitting: Cardiology

## 2022-12-10 ENCOUNTER — Telehealth: Payer: Self-pay | Admitting: Pulmonary Disease

## 2022-12-10 NOTE — Telephone Encounter (Signed)
Dr. Mannam please advise. °

## 2022-12-10 NOTE — Telephone Encounter (Signed)
Ok with me 

## 2022-12-10 NOTE — Telephone Encounter (Signed)
Received MyChart message from patient. He is requesting to switch providers from Dr Isaiah Serge to someone else but did not indicate who. Please advise if switch is allowed.

## 2022-12-11 NOTE — Telephone Encounter (Signed)
Spoke with patient. He is requesting to see a provider in [place]-states he lives in [place] and this will be much closer for him.  I have him scheduled with Dr. Sherene Sires and advised he bring his medications to the apt Closing encounter. NFN

## 2022-12-15 NOTE — Progress Notes (Unsigned)
  Cardiology Office Note:   Date:  12/18/2022  ID:  Juan Martinez, DOB 06-25-1956, MRN [id number] PCP: [facility]  Juan Martinez Cardiologist:  Rollene Rotunda, MD     History of Present Illness:   Juan Martinez is a 67 y.o. male  with a PMH of NSTEMI with 100% occlusion of the midcircumflex after the OM1.     He had a nuclear stress test 8/18 which showed a large inferior wall infarct from the apex and no evidence of ischemia, LVEF 42%. Echo showed mildly reduced left ventricular systolic function, LVEF 45-50%, mid and basal inferior wall akinesis, and mild mitral regurgitation in 2018.  EF was 50% with mild MR, AI and aortic enlargement in Jan 2024.  He had a CEA previously.    Since I last saw him he has done okay.  He gets a couple of episodes of blurring dizziness if he standing for a while but he is not describing orthostatic symptoms.  He has not been having any presyncope or syncope.  This passes quickly.  He has not been getting any of the left arm discomfort he had that was his previous angina.  He stays active.  He denies any new shortness of breath, PND or orthopnea.  Has had no weight gain or edema.  For some reason he stopped taking his statin.  ROS: As stated in the HPI and negative for all other systems.  Studies Reviewed:    EKG:  NA    Risk Assessment/Calculations:     Physical Exam:   VS:  BP (!) 142/70   Pulse 64   Ht 5\' 9"  (1.753 m)   Wt 195 lb (88.5 kg)   BMI 28.80 kg/m    Wt Readings from Last 3 Encounters:  12/18/22 195 lb (88.5 kg)  06/18/22 201 lb 6.4 oz (91.4 kg)  12/19/21 203 lb (92.1 kg)     GEN: Well nourished, well developed in no acute distress NECK: No JVD; No carotid bruits CARDIAC: RRR, 2 out of 6 apical early peaking systolic murmur radiating slightly at the aortic outflow tract, no diastolic murmurs, rubs, gallops RESPIRATORY:  Clear to auscultation without rales, wheezing or rhonchi  ABDOMEN: Soft,  non-tender, non-distended EXTREMITIES:  No edema; No deformity   ASSESSMENT AND PLAN:   CAD:  The patient has no new sypmtoms.  No further cardiovascular testing is indicated.  We will continue with aggressive risk reduction and meds as listed.   Hyperlipidemia: He stopped taking his statin for some reason and I will get a restart this and check a lipid profile in 3 months with a goal LDL of 55.   Hypertension: The blood pressure is not at target.  I am going to increase his amlodipine to 5 mg daily.  Carotid stenosis:     This was mild in Jan 2024.  We will continue risk reduction.  Tobacco abuse: He chuckles when I suggest he stop smoking cigarettes.  He is afraid to use nicotine patches.   Systolic murmur:  He has mild MR, AI and aortic enlargement on echo in Jan 2024.   I will follow all of this clinically.      Follow up me in 1 year or sooner if needed.  Signed, Rollene Rotunda, MD

## 2022-12-18 ENCOUNTER — Other Ambulatory Visit: Payer: Self-pay | Admitting: *Deleted

## 2022-12-18 ENCOUNTER — Ambulatory Visit: Payer: BC Managed Care – PPO | Admitting: Cardiology

## 2022-12-18 ENCOUNTER — Encounter: Payer: Self-pay | Admitting: Cardiology

## 2022-12-18 VITALS — BP 142/70 | HR 64 | Ht 69.0 in | Wt 195.0 lb

## 2022-12-18 DIAGNOSIS — E785 Hyperlipidemia, unspecified: Secondary | ICD-10-CM

## 2022-12-18 DIAGNOSIS — I340 Nonrheumatic mitral (valve) insufficiency: Secondary | ICD-10-CM | POA: Diagnosis not present

## 2022-12-18 DIAGNOSIS — I10 Essential (primary) hypertension: Secondary | ICD-10-CM

## 2022-12-18 DIAGNOSIS — I2510 Atherosclerotic heart disease of native coronary artery without angina pectoris: Secondary | ICD-10-CM

## 2022-12-18 DIAGNOSIS — Z79899 Other long term (current) drug therapy: Secondary | ICD-10-CM

## 2022-12-18 DIAGNOSIS — I6522 Occlusion and stenosis of left carotid artery: Secondary | ICD-10-CM

## 2022-12-18 MED ORDER — AMLODIPINE BESYLATE 5 MG PO TABS: 5.0000 mg | ORAL_TABLET | Freq: Every day | ORAL | 3 refills | Status: DC

## 2022-12-18 MED ORDER — ATORVASTATIN CALCIUM 40 MG PO TABS: 40.0000 mg | ORAL_TABLET | Freq: Every day | ORAL | 3 refills | Status: AC

## 2022-12-18 NOTE — Patient Instructions (Signed)
Medication Instructions:  Please increase Amlodipine to 5 mg a day. Restart Atorvastatin. Continue all other medications as listed.  *If you need a refill on your cardiac medications before your next appointment, please call your pharmacy*  Lab Work: Please have blood work in 3 months at your closest [facility]. (Lipid)  If you have labs (blood work) drawn today and your tests are completely normal, you will receive your results only by: MyChart Message (if you have MyChart) OR A paper copy in the mail If you have any lab test that is abnormal or we need to change your treatment, we will call you to review the results.  Follow-Up: At [facility], you and your health needs are our priority.  As part of our continuing mission to provide you with exceptional heart care, we have created designated Provider Care Teams.  These Care Teams include your primary Cardiologist (physician) and Advanced Practice Providers (APPs -  Physician Assistants and Nurse Practitioners) who all work together to provide you with the care you need, when you need it.  We recommend signing up for the patient portal called "MyChart".  Sign up information is provided on this After Visit Summary.  MyChart is used to connect with patients for Virtual Visits (Telemedicine).  Patients are able to view lab/test results, encounter notes, upcoming appointments, etc.  Non-urgent messages can be sent to your provider as well.   To learn more about what you can do with MyChart, go to [email or web address].    Your next appointment:   1 year(s)  Provider:   Rollene Rotunda, MD

## 2023-01-09 ENCOUNTER — Ambulatory Visit: Payer: BC Managed Care – PPO | Admitting: Pulmonary Disease

## 2023-01-10 ENCOUNTER — Encounter: Payer: Self-pay | Admitting: Internal Medicine

## 2023-01-10 ENCOUNTER — Encounter: Payer: Self-pay | Admitting: Cardiology

## 2023-01-10 MED ORDER — NITROGLYCERIN 0.4 MG SL SUBL: SUBLINGUAL_TABLET | SUBLINGUAL | 3 refills | Status: DC

## 2023-01-13 ENCOUNTER — Ambulatory Visit: Payer: BC Managed Care – PPO | Admitting: Internal Medicine

## 2023-02-15 ENCOUNTER — Other Ambulatory Visit: Payer: Self-pay | Admitting: Pulmonary Disease

## 2023-02-21 ENCOUNTER — Other Ambulatory Visit: Payer: Self-pay | Admitting: Cardiology

## 2023-02-28 ENCOUNTER — Telehealth: Payer: Self-pay | Admitting: Pulmonary Disease

## 2023-02-28 NOTE — Telephone Encounter (Signed)
Pt's daughter calling for a refill of albuterol for her dad. He has since made an appt and you can see before that he has several cancellations and no shows.   Can we call in  a courtesy refill for him. Please call daughter to advise.

## 2023-02-28 NOTE — Telephone Encounter (Signed)
Duplicate

## 2023-02-28 NOTE — Telephone Encounter (Signed)
Pt's daughter calling for a refill of albuterol for her dad. He has since made an appt and you can see before that he has several cancellations and no shows.   Can we call in  a courtesy refill for him. Please call daughter to advise.  [facility] in [place]

## 2023-02-28 NOTE — Telephone Encounter (Signed)
Patient is calling for a refill on his albuterol.

## 2023-03-03 MED ORDER — VENTOLIN HFA 108 (90 BASE) MCG/ACT IN AERS: 2.0000 | INHALATION_SPRAY | Freq: Four times a day (QID) | RESPIRATORY_TRACT | 2 refills | Status: DC | PRN

## 2023-03-03 NOTE — Telephone Encounter (Signed)
Albuterol has been sent to pharmacy and patient is aware. Nothing further needed

## 2023-04-09 DIAGNOSIS — R42 Dizziness and giddiness: Secondary | ICD-10-CM | POA: Diagnosis not present

## 2023-04-11 ENCOUNTER — Ambulatory Visit: Payer: BC Managed Care – PPO | Admitting: Pulmonary Disease

## 2023-05-08 NOTE — Progress Notes (Unsigned)
Juan Martinez, male    DOB: 02/24/1956    MRN: [id number]   Brief patient profile:  67  yowm  active smoker/SZ   HVAC new construction former Mannam pt  self-referred to pulmonary clinic in [place]  05/09/2023  GOLD 2COPD    12/19/2021 FVC 2.86 [68%], FEV1 1.96 [63%], TLC 6.56 [99%], DLCO 22.34 [90%] Moderate obstruction with bronchodilator response and air trapping   Labs: CBC 04/15/2020-WBC 13.6, eos 2%, absolute eosinophil count 272 CBC 07/24/2021-WBC 7.4, eos 5.5%, absolute eosinophil count 407   IgE 07/24/2021-226 Alpha-1 antitrypsin levels and phenotype 07/24/21- 152, PI SZ     History of Present Illness  05/09/2023  Pulmonary/ 1st office eval/ Tahira Olivarez / [facility] [facility]  Chief Complaint  Patient presents with   COPD   Cough  Dyspnea: says on job walks up  5 flights then stops and uses saba  Cough: none  Sleep: sleeps on couch but flat  SABA use: 2 x daily  02: none Lung cancer screen: missed it this year   No obvious day to day or daytime pattern/variability or assoc excess/ purulent sputum or mucus plugs or hemoptysis or cp or chest tightness, subjective wheeze or overt sinus or hb symptoms.    Also denies any obvious fluctuation of symptoms with weather or environmental changes or other aggravating or alleviating factors except as outlined above   No unusual exposure hx or h/o childhood pna/ asthma or knowledge of premature birth.  Current Allergies, Complete Past Medical History, Past Surgical History, Family History, and Social History were reviewed in [employer] record.  ROS  The following are not active complaints unless bolded Hoarseness, sore throat, dysphagia, dental problems, itching, sneezing,  nasal congestion or discharge of excess mucus or purulent secretions, ear ache,   fever, chills, sweats, unintended wt loss or wt gain, classically pleuritic or exertional cp,  orthopnea pnd or arm/hand swelling  or leg swelling, presyncope,  palpitations, abdominal pain, anorexia, nausea, vomiting, diarrhea  or change in bowel habits or change in bladder habits, change in stools or change in urine, dysuria, hematuria,  rash, arthralgias, visual complaints, headache, numbness, weakness or ataxia or problems with walking or coordination,  change in mood or  memory.            Outpatient Medications Prior to Visit  Medication Sig Dispense Refill   amLODipine (NORVASC) 5 MG tablet Take 1 tablet (5 mg total) by mouth daily. 90 tablet 3   aspirin 81 MG tablet Take 81 mg by mouth daily.       atorvastatin (LIPITOR) 40 MG tablet Take 1 tablet (40 mg total) by mouth daily. 90 tablet 3   hydrochlorothiazide (HYDRODIURIL) 25 MG tablet Take 25 mg by mouth daily.     irbesartan (AVAPRO) 300 MG tablet Take 1 tablet (300 mg total) by mouth daily. 90 tablet 3   nitroGLYCERIN (NITROSTAT) 0.4 MG SL tablet PLACE 1 TABLET UNDER THE TONGUE EVERY 5 MINUTES UP TO 3 DOSES 25 tablet 3   albuterol (VENTOLIN HFA) 108 (90 Base) MCG/ACT inhaler Inhale 2 puffs into the lungs every 6 (six) hours as needed for wheezing or shortness of breath. 18 each 2   Budeson-Glycopyrrol-Formoterol (BREZTRI AEROSPHERE) 160-9-4.8 MCG/ACT AERO Inhale 2 puffs into the lungs in the morning and at bedtime. 10.7 g 11   meloxicam (MOBIC) 15 MG tablet Take 15 mg by mouth daily.     metoprolol tartrate (LOPRESSOR) 25 MG tablet TAKE 1 TABLET BY MOUTH  TWICE A DAY 180 tablet 3   albuterol (VENTOLIN HFA) 108 (90 Base) MCG/ACT inhaler INHALE 2 PUFFS INTO THE LUNGS EVERY 4 HOURS AS NEEDED FOR WHEEZE OR FOR SHORTNESS OF BREATH 8.5 each 5   No facility-administered medications prior to visit.    Past Medical History:  Diagnosis Date   CAD (coronary artery disease), native coronary artery 03/2011   NSTEMI of Cx    COPD (chronic obstructive pulmonary disease) (HCC)    Coronary artery disease 07/2010   NSTEMI of RCA inferior   GERD (gastroesophageal reflux disease)    History of kidney  stones    Hypertension    MI (myocardial infarction) (HCC)    Pneumonia    walking pneumonia      Objective:     BP 120/79   Pulse 75   Ht 5\' 9"  (1.753 m)   Wt 198 lb (89.8 kg)   SpO2 94%   BMI 29.24 kg/m   SpO2: 94 % RA  5cmin/ poor dentition    HEENT : Oropharynx  clear, just a few lower teeth remaining and they are in poor condition   Nasal turbinates nl    NECK :  without  apparent JVD/ palpable Nodes/TM    LUNGS: no acc muscle use,  Min barrel  contour chest wall with bilateral  slightly decreased bs s audible wheeze and  without cough on insp or exp maneuvers and min  Hyperresonant  to  percussion bilaterally    CV:  RRR  no s3 or murmur or increase in P2, and no edema   ABD:  soft and nontender with pos end  insp Hoover's  in the supine position.  No bruits or organomegaly appreciated   MS:  Nl gait/ ext warm without deformities Or obvious joint restrictions  calf tenderness, cyanosis or clubbing     SKIN: warm and dry without lesions    NEURO:  alert, approp, nl sensorium with  no motor or cerebellar deficits apparent.           I personally reviewed images and agree with radiology impression as follows:   Chest LDSCT     7.8 mm RLL   Mild paraseptal and centrilobular emphysema      Assessment   COPD GOLD 2/ SZ Active smoker/ SZ - 12/19/2021  FVC 2.86 [68%], FEV1 1.96 [63%], TLC 6.56 [99%], DLCO 22.34 [90%] - 05/09/2023  After extensive coaching inhaler device,  effectiveness =  75% from a baseline of 50% (short ti) > continue breztri    By his hx he must have a significant AB component that improved on breztri but still  Group D (now reclassified as E) in terms of symptom/risk and laba/lama/ICS  therefore appropriate rx at this point >>>  continue breztri and approp saba    Cigarette smoker Counseled re importance of smoking cessation but did not meet time criteria for separate billing    Low-dose CT lung cancer screening is recommended for  patients who are 19-34 years of age with a 20+ pack-year history of smoking and who are currently smoking or quit <=15 years ago. No coughing up blood  No unintentional weight loss of > 15 pounds in the last 6 months - pt is eligible for scanning yearly until age 84 > referred back to [facility] program to f/u ASAP as he has a nodule that needs f/u and is overdue for screening anyway.   Each maintenance medication was reviewed in detail including emphasizing most importantly  the difference between maintenance and prns and under what circumstances the prns are to be triggered using an action plan format where appropriate.  Total time for H and P, chart review, counseling, reviewing hfa device(s) and generating customized AVS unique to this office visit / same day charting > 45 min new pt eval        Sandrea Hughs, MD 05/09/2023

## 2023-05-09 ENCOUNTER — Ambulatory Visit: Payer: BC Managed Care – PPO | Admitting: Internal Medicine

## 2023-05-09 ENCOUNTER — Encounter: Payer: Self-pay | Admitting: Internal Medicine

## 2023-05-09 VITALS — BP 120/79 | HR 75 | Ht 69.0 in | Wt 198.0 lb

## 2023-05-09 DIAGNOSIS — J449 Chronic obstructive pulmonary disease, unspecified: Secondary | ICD-10-CM

## 2023-05-09 DIAGNOSIS — F17210 Nicotine dependence, cigarettes, uncomplicated: Secondary | ICD-10-CM | POA: Diagnosis not present

## 2023-05-09 MED ORDER — ALBUTEROL SULFATE HFA 108 (90 BASE) MCG/ACT IN AERS: 2.0000 | INHALATION_SPRAY | RESPIRATORY_TRACT | 11 refills | Status: DC | PRN

## 2023-05-09 MED ORDER — BREZTRI AEROSPHERE 160-9-4.8 MCG/ACT IN AERO: 2.0000 | INHALATION_SPRAY | Freq: Two times a day (BID) | RESPIRATORY_TRACT | 11 refills | Status: DC

## 2023-05-09 NOTE — Patient Instructions (Addendum)
Plan A = Automatic = Always=    Breztri Take 2 puffs first thing in am and then another 2 puffs about 12 hours later.    Work on inhaler technique:  relax and gently blow all the way out then take a nice smooth full deep breath back in, triggering the inhaler at same time you start breathing in.  Hold breath in for at least  5 seconds if you can. Blow out brezti  thru nose. Rinse and gargle with water when done.  If mouth or throat bother you at all,  try brushing teeth/gums/tongue with arm and hammer toothpaste/ make a slurry and gargle and spit out.    Plan B = Backup (to supplement plan A, not to replace it) Only use your albuterol inhaler as a rescue medication to be used if you can't catch your breath by resting or doing a relaxed purse lip breathing pattern.  - The less you use it, the better it will work when you need it. - Ok to use the inhaler up to 2 puffs  every 4 hours if you must but call for appointment if use goes up over your usual need - Don't leave home without it !!  (think of it like the spare tire for your car)   The key is to stop smoking completely before smoking completely stops you!  You will be contacted by the lung cancer screening program for your follow up studies    Please schedule a follow up visit in 6  months but call sooner if needed

## 2023-05-10 DIAGNOSIS — J449 Chronic obstructive pulmonary disease, unspecified: Secondary | ICD-10-CM | POA: Insufficient documentation

## 2023-05-10 NOTE — Assessment & Plan Note (Addendum)
Counseled re importance of smoking cessation but did not meet time criteria for separate billing    Low-dose CT lung cancer screening is recommended for patients who are 58-69 years of age with a 20+ pack-year history of smoking and who are currently smoking or quit <=15 years ago. No coughing up blood  No unintentional weight loss of > 15 pounds in the last 6 months - pt is eligible for scanning yearly until age 31 > referred back to LCS program to f/u ASAP as he has a nodule that needs f/u and is overdue for screening anyway.   Each maintenance medication was reviewed in detail including emphasizing most importantly the difference between maintenance and prns and under what circumstances the prns are to be triggered using an action plan format where appropriate.  Total time for H and P, chart review, counseling, reviewing hfa device(s) and generating customized AVS unique to this office visit / same day charting > 45 min new pt eval

## 2023-05-10 NOTE — Assessment & Plan Note (Signed)
Active smoker/ SZ - 12/19/2021  FVC 2.86 [68%], FEV1 1.96 [63%], TLC 6.56 [99%], DLCO 22.34 [90%] - 05/09/2023  After extensive coaching inhaler device,  effectiveness =  75% from a baseline of 50% (short ti) > continue breztri    By his hx he must have a significant AB component that improved on breztri but still  Group D (now reclassified as E) in terms of symptom/risk and laba/lama/ICS  therefore appropriate rx at this point >>>  continue breztri and approp saba

## 2023-05-14 ENCOUNTER — Telehealth: Payer: Self-pay | Admitting: Acute Care

## 2023-05-14 NOTE — Telephone Encounter (Signed)
Called patient on both numbers listed, home and mobile, and left VM. Called Daughter and could not leave VM due to VM box being full.   Patient had LDCT in 08/2021 which resulted as an LR3. Patient was scheduled to have follow up nodule scan in 03/2022 at [facility]. Patient's insurance required a stand alone scanner not at a hospital. Patient was then scheduled to have his follow up scan in 03/2022 at [facility], per his request. Patient did not show up for scan (see phone note 06/12/2022) and was advised to follow up with [facility] to reschedule. Patient was then contacted again via MyChart on 06/28/2022 and was given the phone number to the [facility] imagining facility to reschedule his scan. Patient viewed the MyChart message on 06/28/2022.  We have left a VM for pt to call back to reschedule his scan.

## 2023-05-15 ENCOUNTER — Encounter: Payer: Self-pay | Admitting: Acute Care

## 2023-05-15 ENCOUNTER — Other Ambulatory Visit: Payer: Self-pay

## 2023-05-15 DIAGNOSIS — Z87891 Personal history of nicotine dependence: Secondary | ICD-10-CM

## 2023-05-15 DIAGNOSIS — F17210 Nicotine dependence, cigarettes, uncomplicated: Secondary | ICD-10-CM

## 2023-05-15 DIAGNOSIS — Z122 Encounter for screening for malignant neoplasm of respiratory organs: Secondary | ICD-10-CM

## 2023-05-16 NOTE — Telephone Encounter (Signed)
Patient has been rescheduled for LDCT on 05/23/2023 at [facility]. Will keep message in our box to assure he shows up for scan.   FYI.

## 2023-05-23 ENCOUNTER — Ambulatory Visit
Admission: RE | Admit: 2023-05-23 | Discharge: 2023-05-23 | Disposition: A | Discharge status: Home / Self Care | Payer: BC Managed Care – PPO | Source: Ambulatory Visit | Attending: Acute Care | Admitting: Acute Care

## 2023-05-23 DIAGNOSIS — Z87891 Personal history of nicotine dependence: Secondary | ICD-10-CM

## 2023-05-23 DIAGNOSIS — F17210 Nicotine dependence, cigarettes, uncomplicated: Secondary | ICD-10-CM

## 2023-05-23 DIAGNOSIS — Z122 Encounter for screening for malignant neoplasm of respiratory organs: Secondary | ICD-10-CM

## 2023-05-26 NOTE — Telephone Encounter (Signed)
Patient had LDCT as scheduled on 05/23/23. Will await results on LCS dashboard.

## 2023-06-07 ENCOUNTER — Other Ambulatory Visit: Payer: Self-pay | Admitting: Physician Assistant

## 2023-06-07 ENCOUNTER — Other Ambulatory Visit: Payer: Self-pay | Admitting: Cardiology

## 2023-06-10 ENCOUNTER — Other Ambulatory Visit: Payer: Self-pay

## 2023-06-10 MED ORDER — AMLODIPINE BESYLATE 5 MG PO TABS: 5.0000 mg | ORAL_TABLET | Freq: Every day | ORAL | 2 refills | Status: DC

## 2023-07-03 ENCOUNTER — Telehealth: Payer: Self-pay | Admitting: Acute Care

## 2023-07-03 NOTE — Telephone Encounter (Signed)
 Patient had LDCT on 05/23/2023 that resulted as a LR2. Lung nodule has grown from 6.60mm nodule to 10.24mm nodule since 08/2021. Per Lauraine Lites NP patient will need 6 month follow up scan, due 11/21/2023. Per pt's chart he is on statin therapy for noted CAD/aortic atherosclerosis.    IMPRESSION: 1. Lung-RADS 2, benign appearance or behavior. Continue annual screening with low-dose chest CT without contrast in 12 months. 2. Coronary artery calcifications. 3. Bilateral nephrolithiasis. 4.  Aortic Atherosclerosis (ICD10-I70.0).     Electronically Signed   By: Waddell Calk M.D.   On: 06/16/2023 13:25

## 2023-07-04 ENCOUNTER — Other Ambulatory Visit: Payer: Self-pay

## 2023-07-04 DIAGNOSIS — F17210 Nicotine dependence, cigarettes, uncomplicated: Secondary | ICD-10-CM

## 2023-07-04 DIAGNOSIS — Z87891 Personal history of nicotine dependence: Secondary | ICD-10-CM

## 2023-07-04 DIAGNOSIS — Z122 Encounter for screening for malignant neoplasm of respiratory organs: Secondary | ICD-10-CM

## 2023-07-04 DIAGNOSIS — R911 Solitary pulmonary nodule: Secondary | ICD-10-CM

## 2023-07-04 NOTE — Telephone Encounter (Signed)
 Spoke with patient and reviewed results. He agrees to 6 month f/u scan. Order placed and CT scheduled for 11/28/2023 at 10:00 am per patient request.

## 2023-11-28 ENCOUNTER — Ambulatory Visit
Admission: RE | Admit: 2023-11-28 | Discharge: 2023-11-28 | Disposition: A | Discharge status: Home / Self Care | Payer: BC Managed Care – PPO | Source: Ambulatory Visit | Attending: Acute Care | Admitting: Acute Care

## 2023-11-28 DIAGNOSIS — R911 Solitary pulmonary nodule: Secondary | ICD-10-CM | POA: Diagnosis not present

## 2023-11-28 DIAGNOSIS — F17210 Nicotine dependence, cigarettes, uncomplicated: Secondary | ICD-10-CM

## 2023-11-28 DIAGNOSIS — Z87891 Personal history of nicotine dependence: Secondary | ICD-10-CM

## 2023-11-28 DIAGNOSIS — Z122 Encounter for screening for malignant neoplasm of respiratory organs: Secondary | ICD-10-CM

## 2023-12-19 ENCOUNTER — Telehealth: Payer: Self-pay

## 2023-12-19 NOTE — Telephone Encounter (Signed)
 LVM to call office and review results.

## 2023-12-19 NOTE — Telephone Encounter (Signed)
 Results below have been reviewed by Margit Shelling, NP Please call patient and review results. He will need to complete an annual Lung CT again next year. Please advise patient he has a 4.3cm ascending thoracic aortic aneurysm. This can be reassessed on the next annual LDCT. Per recent OV notes his BP is well controlled. He should continue taking his medications as directed by PCP. Please place annual CT order. Forward results and plan to PCP.   IMPRESSION: 1. Lung-RADS 2, benign appearance or behavior. Continue annual screening with low-dose chest CT without contrast in 12 months. 2. 4.3 cm ascending thoracic aortic aneurysm. This can be reassessed on the next annual LDCT. 3. Coronary artery calcifications. 4. Bilateral renal calculi. 5. Aortic Atherosclerosis (ICD10-I70.0) and Emphysema (ICD10-J43.9).

## 2023-12-22 ENCOUNTER — Other Ambulatory Visit: Payer: Self-pay

## 2023-12-22 DIAGNOSIS — F17210 Nicotine dependence, cigarettes, uncomplicated: Secondary | ICD-10-CM

## 2023-12-22 DIAGNOSIS — Z87891 Personal history of nicotine dependence: Secondary | ICD-10-CM

## 2023-12-22 DIAGNOSIS — Z122 Encounter for screening for malignant neoplasm of respiratory organs: Secondary | ICD-10-CM

## 2023-12-22 NOTE — Telephone Encounter (Signed)
 I have called and spoken with the patient's daughter Lauraine, she is on the [place]. Reviewed recent Lung CT results. He will complete his annual Lung CT scan again next year. He will f/u with PCP and Cardiologist regarding a 4.3 cm aortic aneurysm. He is taking his BP medicine and his BP is well controlled. They are aware the aorta will be evaluated again on the annual Lung CT. No questions. Annual CT order placed. Results and plan to PCP and Cardiologist.

## 2024-01-14 DIAGNOSIS — S51811A Laceration without foreign body of right forearm, initial encounter: Secondary | ICD-10-CM | POA: Diagnosis not present

## 2024-01-14 DIAGNOSIS — Z79899 Other long term (current) drug therapy: Secondary | ICD-10-CM | POA: Diagnosis not present

## 2024-01-14 DIAGNOSIS — Y990 Civilian activity done for income or pay: Secondary | ICD-10-CM | POA: Diagnosis not present

## 2024-01-14 DIAGNOSIS — Z23 Encounter for immunization: Secondary | ICD-10-CM | POA: Diagnosis not present

## 2024-01-14 DIAGNOSIS — Z7982 Long term (current) use of aspirin: Secondary | ICD-10-CM | POA: Diagnosis not present

## 2024-01-14 DIAGNOSIS — W010XXA Fall on same level from slipping, tripping and stumbling without subsequent striking against object, initial encounter: Secondary | ICD-10-CM | POA: Diagnosis not present

## 2024-01-22 NOTE — Progress Notes (Unsigned)
 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  self-referred to pulmonary clinic in [place]  05/09/2023  GOLD 2COPD    12/19/2021 FVC 2.86 [68%], FEV1 1.96 [63%], TLC 6.56 [99%], DLCO 22.34 [90%] Moderate obstruction with bronchodilator response and air trapping   Labs: CBC 04/15/2020-WBC 13.6, eos 2%, absolute eosinophil count 272 CBC 07/24/2021-WBC 7.4, eos 5.5%, absolute eosinophil count 407   IgE 07/24/2021-226 Alpha-1 antitrypsin levels and phenotype 07/24/21- 152, PI SZ     History of Present Illness  05/09/2023  Pulmonary/ 1st office eval/ [facility] / [place] [facility]  Chief Complaint  Patient presents with   COPD   Cough  Dyspnea: says on job walks up  5 flights then stops and uses saba  Cough: none  Sleep: sleeps on couch but flat  SABA use: 2 x daily  02: none Lung cancer screen: missed it this year  REC Plan A = Automatic = Always=    Breztri  Take 2 puffs first thing in am and then another 2 puffs about 12 hours later.  Work on inhaler technique:   Plan B = Backup (to supplement plan A, not to replace it) Only use your albuterol  inhaler as a rescue medication  The key is to stop smoking completely before smoking completely stops you! You will be contacted by the lung cancer screening program for your follow up studies        01/23/2024  f/u ov/[place] office/Shatima Zalar re: GOLD 2/ SZ  maint on breztri   2 puff in in am / pm doses optional   still smoking  Chief Complaint  Patient presents with   Follow-up    copd  Dyspnea:  no change / improved to his satisfaction Cough: am's are a problem with clear mucus x 20 min daily   Sleeping: flat bed/ one pillow s noct    resp cc  SABA use:  around noon 02: none   Lung cancer screening: due 11/17/24    No obvious day to day or daytime variability or assoc purulent sputum or mucus plugs or hemoptysis or cp or chest  tightness, subjective wheeze or overt  hb symptoms.    Also denies any obvious fluctuation of symptoms with weather or environmental changes or other aggravating or alleviating factors except as outlined above   No unusual exposure hx or h/o childhood pna/ asthma or knowledge of premature birth.  Current Allergies, Complete Past Medical History, Past Surgical History, Family History, and Social History were reviewed in [employer] record.  ROS  The following are not active complaints unless bolded Hoarseness, sore throat, dysphagia, dental problems, itching, sneezing,  nasal congestion or discharge of excess mucus or purulent secretions, ear ache,   fever, chills, sweats, unintended wt loss or wt gain, classically pleuritic or exertional cp,  orthopnea pnd or arm/hand swelling  or leg swelling, presyncope, palpitations, abdominal pain, anorexia, nausea, vomiting, diarrhea  or change in bowel habits or change in bladder habits, change in stools or change in urine, dysuria, hematuria,  rash, arthralgias, visual complaints, headache, numbness, weakness or ataxia or problems with walking or coordination,  change in mood or  memory.        Current Meds  Medication Sig   amLODipine  (NORVASC ) 5 MG tablet Take 1 tablet (5 mg total) by mouth daily.   aspirin  81 MG  tablet Take 81 mg by mouth daily.     atorvastatin  (LIPITOR) 40 MG tablet Take 1 tablet (40 mg total) by mouth daily.   hydrochlorothiazide (HYDRODIURIL) 25 MG tablet Take 25 mg by mouth daily.   irbesartan  (AVAPRO ) 300 MG tablet TAKE 1 TABLET BY MOUTH EVERY DAY   nitroGLYCERIN  (NITROSTAT ) 0.4 MG SL tablet PLACE 1 TABLET UNDER THE TONGUE EVERY 5 MINUTES UP TO 3 DOSES            Past Medical History:  Diagnosis Date   CAD (coronary artery disease), native coronary artery 03/2011   NSTEMI of Cx    COPD (chronic obstructive pulmonary disease) (HCC)    Coronary artery disease 07/2010   NSTEMI of RCA inferior   GERD  (gastroesophageal reflux disease)    History of kidney stones    Hypertension    MI (myocardial infarction) (HCC)    Pneumonia    walking pneumonia      Objective:       Wt Readings from Last 3 Encounters:  01/23/24 202 lb 12.8 oz (92 kg)  05/09/23 198 lb (89.8 kg)  12/18/22 195 lb (88.5 kg)      Vital signs reviewed  01/23/2024  - Note at rest 02 sats  96% on RA   General appearance:    amb wm nad     HEENT : Oropharynx  clear   Nasal turbinates nl    NECK :  without  apparent JVD/ palpable Nodes/TM    LUNGS: no acc muscle use,  Mild barrel  contour chest wall with bilateral  Distant bs s audible wheeze and  without cough on insp or exp maneuvers  and mild  Hyperresonant  to  percussion bilaterally     CV:  RRR  no s3 or murmur or increase in P2, and no edema   ABD:  soft and nontender  MS:  Nl gait/ ext warm without deformities Or obvious joint restrictions  calf tenderness, cyanosis or clubbing     SKIN: warm and dry without lesions    NEURO:  alert, approp, nl sensorium with  no motor or cerebellar deficits apparent.        Assessment

## 2024-01-23 ENCOUNTER — Ambulatory Visit: Admitting: Internal Medicine

## 2024-01-23 ENCOUNTER — Encounter: Payer: Self-pay | Admitting: Internal Medicine

## 2024-01-23 VITALS — BP 136/70 | HR 84 | Ht 69.0 in | Wt 202.8 lb

## 2024-01-23 DIAGNOSIS — J449 Chronic obstructive pulmonary disease, unspecified: Secondary | ICD-10-CM | POA: Diagnosis not present

## 2024-01-23 DIAGNOSIS — F17210 Nicotine dependence, cigarettes, uncomplicated: Secondary | ICD-10-CM | POA: Diagnosis not present

## 2024-01-23 MED ORDER — ALBUTEROL SULFATE HFA 108 (90 BASE) MCG/ACT IN AERS: 2.0000 | INHALATION_SPRAY | RESPIRATORY_TRACT | 11 refills | Status: AC | PRN

## 2024-01-23 MED ORDER — BREZTRI AEROSPHERE 160-9-4.8 MCG/ACT IN AERO: 2.0000 | INHALATION_SPRAY | Freq: Two times a day (BID) | RESPIRATORY_TRACT | 11 refills | Status: AC

## 2024-01-23 NOTE — Patient Instructions (Signed)
 No change in medications for now   The key is to stop smoking completely before smoking completely stops you!  Please schedule a follow up visit in 6 months but call sooner if needed - bring inhalers

## 2024-01-23 NOTE — Assessment & Plan Note (Signed)
 Active smoker/ SZ - 12/19/2021  FVC 2.86 [68%], FEV1 1.96 [63%], TLC 6.56 [99%], DLCO 22.34 [90%] - 05/09/2023  continue breztri      >>> 01/23/2024  After extensive coaching inhaler device,  effectiveness =    75%  hfa > continue breztri  and approp saba   Re SABA :  I spent extra time with pt today reviewing appropriate use of albuterol  for prn use on exertion with the following points: 1) saba is for relief of sob that does not improve by walking a slower pace or resting but rather if the pt does not improve after trying this first. 2) If the pt is convinced, as many are, that saba helps recover from activity faster then it's easy to tell if this is the case by re-challenging : ie stop, take the inhaler, then p 5 minutes try the exact same activity (intensity of workload) that just caused the symptoms and see if they are substantially diminished or not after saba 3) if there is an activity that reproducibly causes the symptoms, try the saba 15 min before the activity on alternate days   If in fact the saba really does help, then fine to continue to use it prn but advised may need to look closer at the maintenance regimen (breztri  either 2 each am or full dose 2 bid) being used to achieve better control of airways disease with exertion.

## 2024-01-23 NOTE — Assessment & Plan Note (Signed)
 Counseled re importance of smoking cessation but did not meet time criteria for separate billing    - LDSCT due q may    F/u q 6 m, sooner if needed  Each maintenance medication was reviewed in detail including emphasizing most importantly the difference between maintenance and prns and under what circumstances the prns are to be triggered using an action plan format where appropriate.  Total time for H and P, chart review, counseling, reviewing hfa  device(s) and generating customized AVS unique to this office visit / same day charting = 30 min

## 2024-02-22 DIAGNOSIS — I351 Nonrheumatic aortic (valve) insufficiency: Secondary | ICD-10-CM | POA: Insufficient documentation

## 2024-02-22 NOTE — Progress Notes (Unsigned)
 Cardiology Office Note:   Date:  02/25/2024  ID:  [name], DOB 09-06-1955, MRN [id number] PCP: [facility]  [facility] Providers Cardiologist:  Lynwood Schilling, MD {  History of Present Illness:   Juan Martinez is a 68 y.o. male with a PMH of NSTEMI with 100% occlusion of the midcircumflex after the OM1.     He had a nuclear stress test 8/18 which showed a large inferior wall infarct from the apex and no evidence of ischemia, LVEF 42%. Echo showed mildly reduced left ventricular systolic function, LVEF 45-50%, mid and basal inferior wall akinesis, and mild mitral regurgitation in 2018.  EF was 50% with mild MR, AI and aortic enlargement in Jan 2024.  He had a CEA previously.     Since I last saw him he has done okay.  He still works a very vigorous job.  The patient denies any new symptoms such as chest discomfort, neck or arm discomfort. There has been no new shortness of breath, PND or orthopnea. There have been no reported palpitations, presyncope or syncope.   ROS: As stated in the HPI and negative for all other systems.  Studies Reviewed:    EKG:   EKG Interpretation Date/Time:  Wednesday February 25 2024 09:03:32 EDT Ventricular Rate:  77 PR Interval:  136 QRS Duration:  110 QT Interval:  406 QTC Calculation: 459 R Axis:   83  Text Interpretation: Sinus rhythm with occasional Premature ventricular complexes Inferior infarct , age undetermined When compared with ECG of Dec 2023 No significant change since last tracing Confirmed by Schilling Lynwood ([id number]) on 02/25/2024 9:10:22 AM    Risk Assessment/Calculations:          Physical Exam:   VS:  BP (!) 160/80   Pulse 77   Ht 5' 9 (1.753 m)   Wt 208 lb (94.3 kg)   BMI 30.72 kg/m    Wt Readings from Last 3 Encounters:  02/25/24 208 lb (94.3 kg)  01/23/24 202 lb 12.8 oz (92 kg)  05/09/23 198 lb (89.8 kg)     GEN: Well nourished, well developed in no acute distress NECK: No JVD; No  carotid bruits CARDIAC: RRR, 2/6 systolic murmur radiating at the aortic outflow tract, no diastolic murmurs, rubs, gallops RESPIRATORY:  Clear to auscultation without rales, wheezing or rhonchi  ABDOMEN: Soft, non-tender, non-distended EXTREMITIES:  No edema; No deformity   ASSESSMENT AND PLAN:   CAD:  The patient has no new sypmtoms.  No further cardiovascular testing is indicated.  We will continue with aggressive risk reduction and meds as listed.  Hyperlipidemia:   LDL was not checked recently.  Goal is LDL of 55.  I will follow-up with a lipid profile.  Hypertension: The blood pressure is not at target.  I will restart amlodipine  which she stopped and in fact increase the dose to 10 mg daily and he will get a blood pressure diary that he will drop off for me to review.   Carotid stenosis:     This was mild in Jan 2024.  No change in therapy.   Tobacco abuse: He says he has cut down but he is not able to quit.     Systolic murmur: He has had some mild MR, AI.  I will follow this clinically.   Aortic root enlargement: This was 43 mm on his surveillance CT for lung cancer and he will have this scheduled again for next year   Follow  up with me in 6 months so I can better follow his blood pressure goals.  Signed, Lynwood Schilling, MD

## 2024-02-25 ENCOUNTER — Encounter: Payer: Self-pay | Admitting: Cardiology

## 2024-02-25 ENCOUNTER — Ambulatory Visit (INDEPENDENT_AMBULATORY_CARE_PROVIDER_SITE_OTHER): Admitting: Cardiology

## 2024-02-25 VITALS — BP 160/80 | HR 77 | Ht 69.0 in | Wt 208.0 lb

## 2024-02-25 DIAGNOSIS — I2510 Atherosclerotic heart disease of native coronary artery without angina pectoris: Secondary | ICD-10-CM | POA: Diagnosis not present

## 2024-02-25 DIAGNOSIS — Z79899 Other long term (current) drug therapy: Secondary | ICD-10-CM

## 2024-02-25 DIAGNOSIS — I10 Essential (primary) hypertension: Secondary | ICD-10-CM | POA: Diagnosis not present

## 2024-02-25 DIAGNOSIS — I351 Nonrheumatic aortic (valve) insufficiency: Secondary | ICD-10-CM

## 2024-02-25 DIAGNOSIS — E785 Hyperlipidemia, unspecified: Secondary | ICD-10-CM

## 2024-02-25 MED ORDER — AMLODIPINE BESYLATE 10 MG PO TABS: 10.0000 mg | ORAL_TABLET | Freq: Every day | ORAL | 3 refills | Status: AC

## 2024-02-25 MED ORDER — NITROGLYCERIN 0.4 MG SL SUBL: SUBLINGUAL_TABLET | SUBLINGUAL | 3 refills | Status: AC

## 2024-02-25 NOTE — Patient Instructions (Addendum)
 Medication Instructions:   Increase Norvasc  10mg  daily  Continue all other medications.     Labwork:  BMET, TSH, CBC - orders given today Office will contact with results via phone, letter or mychart.     Testing/Procedures:  none  Follow-Up:  6 months   Any Other Special Instructions Will Be Listed Below (If Applicable).  Please keep BP log x 2 weeks & return to office for provider review.    If you need a refill on your cardiac medications before your next appointment, please call your pharmacy.

## 2024-02-27 DIAGNOSIS — I10 Essential (primary) hypertension: Secondary | ICD-10-CM | POA: Diagnosis not present

## 2024-02-27 DIAGNOSIS — Z79899 Other long term (current) drug therapy: Secondary | ICD-10-CM | POA: Diagnosis not present

## 2024-02-28 LAB — CBC
Hematocrit: 34.5 % — ABNORMAL LOW (ref 37.5–51.0)
Hemoglobin: 11.2 g/dL — ABNORMAL LOW (ref 13.0–17.7)
MCH: 32.2 pg (ref 26.6–33.0)
MCHC: 32.5 g/dL (ref 31.5–35.7)
MCV: 99 fL — ABNORMAL HIGH (ref 79–97)
Platelets: 174 x10E3/uL (ref 150–450)
RBC: 3.48 x10E6/uL — ABNORMAL LOW (ref 4.14–5.80)
RDW: 13.6 % (ref 11.6–15.4)
WBC: 9.3 x10E3/uL (ref 3.4–10.8)

## 2024-02-28 LAB — BASIC METABOLIC PANEL WITH GFR
BUN/Creatinine Ratio: 15 (ref 10–24)
BUN: 21 mg/dL (ref 8–27)
CO2: 20 mmol/L (ref 20–29)
Calcium: 9 mg/dL (ref 8.6–10.2)
Chloride: 102 mmol/L (ref 96–106)
Creatinine, Ser: 1.42 mg/dL — ABNORMAL HIGH (ref 0.76–1.27)
Glucose: 143 mg/dL — ABNORMAL HIGH (ref 70–99)
Potassium: 3.6 mmol/L (ref 3.5–5.2)
Sodium: 140 mmol/L (ref 134–144)
eGFR: 53 mL/min/1.73 — ABNORMAL LOW (ref >59)

## 2024-02-28 LAB — TSH: TSH: 1.64 u[IU]/mL (ref 0.450–4.500)

## 2024-02-29 ENCOUNTER — Ambulatory Visit: Payer: Self-pay | Admitting: Cardiology

## 2024-02-29 DIAGNOSIS — I10 Essential (primary) hypertension: Secondary | ICD-10-CM

## 2024-02-29 DIAGNOSIS — Z79899 Other long term (current) drug therapy: Secondary | ICD-10-CM

## 2024-03-02 ENCOUNTER — Telehealth: Payer: Self-pay

## 2024-03-02 ENCOUNTER — Ambulatory Visit: Payer: Self-pay

## 2024-03-02 ENCOUNTER — Encounter: Payer: Self-pay | Admitting: Internal Medicine

## 2024-03-02 NOTE — Telephone Encounter (Signed)
Pt needs acute visit

## 2024-03-02 NOTE — Telephone Encounter (Signed)
 Erroneous Encounter

## 2024-03-02 NOTE — Telephone Encounter (Signed)
 Patient is scheduled 03/03/24 at 4:00 pm at [facility] [facility] with Dr. Darlean-

## 2024-03-03 ENCOUNTER — Encounter: Payer: Self-pay | Admitting: Internal Medicine

## 2024-03-03 ENCOUNTER — Ambulatory Visit: Admitting: Internal Medicine

## 2024-03-03 ENCOUNTER — Ambulatory Visit

## 2024-03-03 VITALS — BP 173/77 | HR 83 | Temp 98.3°F | Ht 69.0 in | Wt 208.0 lb

## 2024-03-03 DIAGNOSIS — F17210 Nicotine dependence, cigarettes, uncomplicated: Secondary | ICD-10-CM

## 2024-03-03 DIAGNOSIS — R918 Other nonspecific abnormal finding of lung field: Secondary | ICD-10-CM | POA: Diagnosis not present

## 2024-03-03 DIAGNOSIS — R0609 Other forms of dyspnea: Secondary | ICD-10-CM | POA: Insufficient documentation

## 2024-03-03 DIAGNOSIS — J42 Unspecified chronic bronchitis: Secondary | ICD-10-CM | POA: Diagnosis not present

## 2024-03-03 DIAGNOSIS — R0602 Shortness of breath: Secondary | ICD-10-CM | POA: Diagnosis not present

## 2024-03-03 DIAGNOSIS — J449 Chronic obstructive pulmonary disease, unspecified: Secondary | ICD-10-CM

## 2024-03-03 MED ORDER — METHYLPREDNISOLONE ACETATE 80 MG/ML IJ SUSP
120.0000 mg | Freq: Once | INTRAMUSCULAR | Status: AC
  Administered 2024-03-03: 120 mg via INTRAMUSCULAR

## 2024-03-03 NOTE — Assessment & Plan Note (Addendum)
 Active smoker/ SZ - 12/19/2021  FVC 2.86 [68%], FEV1 1.96 [63%], TLC 6.56 [99%], DLCO 22.34 [90%] - 05/09/2023  continue breztri     - 01/23/2024   continue breztri  and approp saba  - 03/03/2024  After extensive coaching inhaler device,  effectiveness =    75% hfa (short ti/ baseline 25%)   Acute flare of so is more likely cardiac asthma (with new but small pleural effusions) than copd exac in setting of documented valvular heart dz and high afterload at systolic > 170    NB:  Note that pleural effusion and copd have the same effect on insp muscles/mechanics (both shorten their length prior to inspiration making them weaker with less force reserve) so they are synergistic in causing sob.   Rec -Depomedrol 120 mg IM in case there is a component of airways inflammation not obvious to me today -No change resp rxotherwise but keep working on technique -Hyrdro diuril x 50 mg now and in am and then return to RS loffice for doe labs/ to er in meantime if needed

## 2024-03-03 NOTE — Patient Instructions (Addendum)
 Please remember to go to the  x-ray department  for your tests - we will call you with the results when they are available    Please remember to go to the lab department at the [facility] clinic in AM Thursday   for your tests - we will call you with the results when they are available.  Two hydrodiuril today and   repeat in  am before you go to the lab (and then a don't take any more until you hear from me)   Work on inhaler technique:  relax and gently blow all the way out then take a nice smooth full deep breath back in, triggering the inhaler at same time you start breathing in.  Hold breath in for at least  5 seconds if you can. Blow out breztri   thru nose. Rinse and gargle with water  when done.  If mouth or throat bother you at all,  try brushing teeth/gums/tongue with arm and hammer toothpaste/ make a slurry and gargle and spit out.   Remember albuterol  is your rescue - take it up to 2 puffs every 4 hours if you can't catch your breath after you sit and rest   Depomedro 120 mg IM today will cover any copd component in the short run  If your breathing gets worse > to er

## 2024-03-03 NOTE — Assessment & Plan Note (Addendum)
 Counseled re importance of maintaining smoking cessation but did not meet time criteria for separate billing           Each maintenance medication was reviewed in detail including emphasizing most importantly the difference between maintenance and prns and under what circumstances the prns are to be triggered using an action plan format where appropriate.  Total time for H and P, chart review, counseling, reviewing hfa device(s) and generating customized AVS unique to this ACUTE office visit / same day charting = 42 min

## 2024-03-03 NOTE — Progress Notes (Signed)
 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 Mild leak of the mitral valve. Mild leak of the aortic valve. LA/ RA ok      History of Present Illness  05/09/2023  Pulmonary/ 1st office eval/ Juan Martinez / [place] [facility]  Chief Complaint  Patient presents with   COPD   Cough  Dyspnea: says on job walks up  5 flights then stops and uses saba  Cough: none  Sleep: sleeps on couch but flat  SABA use: 2 x daily  02: none Lung cancer screen: missed it this year  REC Plan A = Automatic = Always=    Breztri  Take 2 puffs first thing in am and then another 2 puffs about 12 hours later.  Work on inhaler technique:   Plan B = Backup (to supplement plan A, not to replace it) Only use your albuterol  inhaler as a rescue medication  The key is to stop smoking completely before smoking completely stops you! You will be contacted by the lung cancer screening program for your follow up studies        01/23/2024  f/u ov/Juan office/Juan Martinez re: GOLD 2/ SZ  maint on breztri   2 puff in in am / pm doses optional   still smoking  Chief Complaint  Patient presents with   Follow-up    copd  Dyspnea:  no change / improved to his satisfaction Cough: am's are a problem with clear mucus x 20 min daily   Sleeping: flat bed/ one pillow s noct    resp cc  SABA use:  around noon 02: none  Lung cancer screening: due 11/17/24  Rec No change  in medications for now  The key is to stop smoking completely before smoking completely stops you! Please schedule a follow up visit in 6 months but call sooner if needed - bring inhalers   03/03/2024  ACUTE  ov/Juan Martinez re: GOLD 2 / SZ   maint on breztri / smoking stopped on Feb 26 2024 with onset of sob  Chief Complaint  Patient presents with   Shortness of Breath    SOB with exertion.  Sx x 1 week  Dyspnea:  across the room  Cough: mostly dry cough  Sleeping: on a couch flat s  resp cc until up and walks to [place] SABA use: 6 puffs a day 02: none      No obvious day to day or daytime variability or assoc excess/ purulent sputum or mucus plugs or hemoptysis or cp or chest tightness, subjective wheeze or overt sinus or hb symptoms.    Also denies any obvious fluctuation of symptoms with weather or environmental changes or other aggravating or alleviating factors except as outlined above   No unusual exposure hx or h/o childhood pna/ asthma or knowledge of premature birth.  Current Allergies, Complete Past Medical History, Past Surgical History, Family History, and Social  History were reviewed in [facility] electronic medical record.  ROS  The following are not active complaints unless bolded Hoarseness, sore throat, dysphagia, dental problems, itching, sneezing,  nasal congestion or discharge of excess mucus or purulent secretions, ear ache,   fever, chills, sweats, unintended wt loss or wt gain, classically pleuritic or exertional cp,  orthopnea pnd or arm/hand swelling  or leg swelling R>L , presyncope, palpitations, abdominal pain, anorexia, nausea, vomiting, diarrhea  or change in bowel habits or change in bladder habits, change in stools or change in urine, dysuria, hematuria,  rash, arthralgias, visual complaints, headache, numbness, weakness or ataxia or problems with walking or coordination,  change in mood or  memory.        Current Meds  Medication Sig   albuterol  (VENTOLIN  HFA)  108 (90 Base) MCG/ACT inhaler Inhale 2 puffs into the lungs every 4 (four) hours as needed for wheezing or shortness of breath.   amLODipine  (NORVASC ) 10 MG tablet Take 1 tablet (10 mg total) by mouth daily.   aspirin  81 MG tablet Take 81 mg by mouth daily.     atorvastatin  (LIPITOR) 40 MG tablet Take 1 tablet (40 mg total) by mouth daily.   budesonide-glycopyrrolate -formoterol (BREZTRI  AEROSPHERE) 160-9-4.8 MCG/ACT AERO inhaler Inhale 2 puffs into the lungs in the morning and at bedtime. Take 2 puffs first thing in am and then another 2 puffs about 12 hours later.   irbesartan  (AVAPRO ) 300 MG tablet TAKE 1 TABLET BY MOUTH EVERY DAY   nitroGLYCERIN  (NITROSTAT ) 0.4 MG SL tablet PLACE 1 TABLET UNDER THE TONGUE EVERY 5 MINUTES UP TO 3 DOSES           Past Medical History:  Diagnosis Date   CAD (coronary artery disease), native coronary artery 03/2011   NSTEMI of Cx    COPD (chronic obstructive pulmonary disease) (HCC)    Coronary artery disease 07/2010   NSTEMI of RCA inferior   GERD (gastroesophageal reflux disease)    History of kidney stones    Hypertension    MI (myocardial infarction) (HCC)    Pneumonia    walking pneumonia      Objective:    wts   03/03/2024          208   01/23/24 202 lb 12.8 oz (92 kg)  05/09/23 198 lb (89.8 kg)  12/18/22 195 lb (88.5 kg)    Vital signs reviewed  03/03/2024  - Note at rest 02 sats  96% on RA   General appearance:    elderly wm/ minimal smoker's raltle     HEENT : Oropharynx  clear     NECK :  without  apparent JVD/ palpable Nodes/TM    LUNGS: no acc muscle use,  Mild barrel  contour chest wall with bilateral  Distant bs s audible wheeze and  without cough on insp or exp maneuvers  and mild  Hyperresonant  to  percussion bilaterally     CV:  RRR  no s3 or murmur or increase in P2, and pittting edema R>L LE   ABD:  soft and nontender with pos end  insp Hoover's  in the supine position.  No bruits or organomegaly appreciated   MS:   Nl gait/ ext warm without deformities Or obvious joint restrictions  calf tenderness, cyanosis or clubbing     SKIN: warm and dry without lesions    NEURO:  alert, approp, nl sensorium with  no motor or cerebellar deficits apparent.  CXR PA and Lateral:   03/03/2024 :    I personally reviewed images and impression is as follows:     CM with small L pseudotumor due to L > R pl effusion  Assessment     Assessment & Plan DOE (dyspnea on exertion)  COPD GOLD 2/ SZ Active smoker/ SZ - 12/19/2021  FVC 2.86 [68%], FEV1 1.96 [63%], TLC 6.56 [99%], DLCO 22.34 [90%] - 05/09/2023  continue breztri     - 01/23/2024   continue breztri  and approp saba  - 03/03/2024  After extensive coaching inhaler device,  effectiveness =    75% hfa (short ti/ baseline 25%)   Acute flare of so is more likely cardiac asthma (with new but small pleural effusions) than copd exac in setting of documented valvular heart dz and high afterload at systolic > 170    NB:  Note that pleural effusion and copd have the same effect on insp muscles/mechanics (both shorten their length prior to inspiration making them weaker with less force reserve) so they are synergistic in causing sob.   Rec -Depomedrol 120 mg IM in case there is a component of airways inflammation not obvious to me today -No change resp rxotherwise but keep working on technique -Hyrdro diuril x 50 mg now and in am and then return to RS loffice for doe labs/ to er in meantime if needed     Cigarette smoker Counseled re importance of maintaining smoking cessation but did not meet time criteria for separate billing           Each maintenance medication was reviewed in detail including emphasizing most importantly the difference between maintenance and prns and under what circumstances the prns are to be triggered using an action plan format where appropriate.  Total time for H and P, chart review, counseling, reviewing hfa device(s) and generating  customized AVS unique to this ACUTE office visit / same day charting = 42 min         AVS  Patient Instructions  Please remember to go to the  x-ray department  for your tests - we will call you with the results when they are available    Please remember to go to the lab department at the [facility] clinic in AM Thursday   for your tests - we will call you with the results when they are available.  Two hydrodiuril today and   repeat in  am before you go to the lab (and then a don't take any more until you hear from me)   Work on inhaler technique:  relax and gently blow all the way out then take a nice smooth full deep breath back in, triggering the inhaler at same time you start breathing in.  Hold breath in for at least  5 seconds if you can. Blow out breztri   thru nose. Rinse and gargle with water  when done.  If mouth or throat bother you at all,  try brushing teeth/gums/tongue with arm and hammer toothpaste/ make a slurry and gargle and spit out.   Remember albuterol  is your rescue - take it up to 2 puffs every 4 hours if you can't catch your breath after you sit and rest   Depomedro 120 mg IM today will cover any copd component in the short run  If your breathing gets worse > to er                 Ozell America, MD 03/03/2024

## 2024-03-04 ENCOUNTER — Encounter: Payer: Self-pay | Admitting: Internal Medicine

## 2024-03-04 ENCOUNTER — Ambulatory Visit: Payer: Self-pay | Admitting: Internal Medicine

## 2024-03-04 ENCOUNTER — Other Ambulatory Visit: Payer: Self-pay

## 2024-03-04 DIAGNOSIS — J449 Chronic obstructive pulmonary disease, unspecified: Secondary | ICD-10-CM | POA: Diagnosis not present

## 2024-03-04 NOTE — Telephone Encounter (Signed)
 FYI Only or Action Required?: Action required by provider: medication refill request.  Patient is followed in Pulmonology for COPD GOLD, last seen on 03/03/2024 by Darlean Ozell NOVAK, MD.  Called Nurse Triage for a Medication Refill.  Triage Disposition: Call PCP Now  Patient/caregiver understands and will follow disposition?: Yes       Copied from CRM #[id number]. Topic: Clinical - Red Word Triage >> Mar 04, 2024  3:19 PM Whitney O wrote: [facility] that prompted transfer to Nurse Triage: shortness of breath . He has fluid on the lungs . He thought he had the medication but he doesn't . hydrochlorothiazide (HYDRODIURIL) 25 MG tablet sent a message to clinic also but its after three and doctor told him to start on medication but he doesn't have any and needing some to be called in       Reason for Disposition  [1] Prescription refill request for ESSENTIAL medicine (i.e., likelihood of harm to patient if not taken) AND [2] triager unable to refill per department policy  Answer Assessment - Initial Assessment Questions Patient believed he had Hydrochlorothiazide at home but did not, and would need a new prescription sent to his pharmacy. Please advise.     1. DRUG NAME: What medicine do you need to have refilled?     hydrochlorothiazide (HYDRODIURIL) 25 MG tablet 4. PRESCRIBER: Who prescribed it? Note: The prescribing doctor or group is responsible for refill approvals..     Dr. Darlean 5. PHARMACY: Have you contacted your pharmacy (drugstore)? Note: Some pharmacies will contact the doctor (or NP/PA).      Pharmacy  [id number] - [place], [place] - [address] AT [facility] [address] [place], [place] [address] Phone: [phone]  Fax: [phone] DEA #: [id number] DAW Reason: --   6. SYMPTOMS: Do you have any symptoms?     Still experiencing shortness of breath  Protocols used: Medication Refill and Renewal Call-A-AH

## 2024-03-05 ENCOUNTER — Ambulatory Visit: Payer: Self-pay

## 2024-03-05 LAB — CBC
Hematocrit: 36.1 % — ABNORMAL LOW (ref 37.5–51.0)
Hemoglobin: 11.9 g/dL — ABNORMAL LOW (ref 13.0–17.7)
MCH: 32.6 pg (ref 26.6–33.0)
MCHC: 33 g/dL (ref 31.5–35.7)
MCV: 99 fL — ABNORMAL HIGH (ref 79–97)
Platelets: 218 x10E3/uL (ref 150–450)
RBC: 3.65 x10E6/uL — ABNORMAL LOW (ref 4.14–5.80)
RDW: 13.8 % (ref 11.6–15.4)
WBC: 7.2 x10E3/uL (ref 3.4–10.8)

## 2024-03-05 LAB — BASIC METABOLIC PANEL WITH GFR
BUN/Creatinine Ratio: 16 (ref 10–24)
BUN: 20 mg/dL (ref 8–27)
CO2: 23 mmol/L (ref 20–29)
Calcium: 9.3 mg/dL (ref 8.6–10.2)
Chloride: 100 mmol/L (ref 96–106)
Creatinine, Ser: 1.29 mg/dL — ABNORMAL HIGH (ref 0.76–1.27)
Glucose: 86 mg/dL (ref 70–99)
Potassium: 3.9 mmol/L (ref 3.5–5.2)
Sodium: 140 mmol/L (ref 134–144)
eGFR: 60 mL/min/1.73 (ref >59)

## 2024-03-05 LAB — D-DIMER, QUANTITATIVE: D-DIMER: 1.05 mg{FEU}/L — ABNORMAL HIGH (ref 0.00–0.49)

## 2024-03-05 LAB — BRAIN NATRIURETIC PEPTIDE: BNP: 260.8 pg/mL — ABNORMAL HIGH (ref 0.0–100.0)

## 2024-03-05 MED ORDER — FUROSEMIDE 40 MG PO TABS: 40.0000 mg | ORAL_TABLET | Freq: Every day | ORAL | 0 refills | Status: AC

## 2024-03-05 NOTE — Telephone Encounter (Signed)
 Copied from CRM [id number]. Topic: Clinical - Prescription Issue >> Mar 04, 2024  3:15 PM Whitney O wrote: Reason for CRM: had messaged the doctor the doctor told him he has fluid in his lungs . dad saID he had the medication . dad does not have the medication he thought he had to remove the fluid off his lungs . patient daughter is requesting to send some medication in for the fluid on his lungs or what to do now since he doesnt have the medication   hydrochlorothiazide (HYDRODIURIL) 25 MG tablet [phone]  [id number] [place], [place] - [address] [place] [address] Phone: [phone] Fax: [phone] Hours: Not open 24 hours   Called and spoke with the pts daughter (DPR). Pt was seen 9/3 and was told to take 2 pills of Hydrodiuril. Pt has not taken this med for a while and didn't have any pills, and none was sent to the pharmacy. Pt has not taken Hydrodiuril that he was told to take by Dr. Darlean 9/3. Pt came in for labs 9/4.   Dr. Darlean can you please advise on next steps. Pt did not take Hydrodiuril medication.

## 2024-03-05 NOTE — Addendum Note (Signed)
 Addended by: ZAIDA MASON on: 03/05/2024 04:41 PM   Modules accepted: Orders

## 2024-03-05 NOTE — Telephone Encounter (Signed)
 On Call [facility] provider Zola Donald MD returned page. Provider given patient name and MRN-explained medication regimen in regard to lasix  order. Explained patient's provider had messaged with medication orders but orders weren't placed in the system. Hatter MD reviewed provider message from today. Lasix  order sent to pharmacy on file.   Daughter called and updated on mediation. Daughter was told on the phone earlier today that patient did have blood work for the [employer] office but there are no orders in the system. Daughter is asking for clarification from pulmonary.

## 2024-03-05 NOTE — Telephone Encounter (Signed)
 Triager paged [facility] on-call to assist with medication regimen listed in the 03/04/24 NT Encounter re: diuretics.     Copied from CRM [id number]. Topic: Clinical - Prescription Issue >> Mar 05, 2024  4:08 PM Russell PARAS wrote: Reason for CRM:   Pt's daughter contacted office regarding the fluid on her dad's lungs. She spoke w/nurse this morning concerning the hydrochlorothiazide (HYDRODIURIL) 25 MG tablet and advised nurse pt didn't have any remaining pills and had not been taking the medication. Nurse was to reach out to provider for recommendations. Reviewed chart and provider had sent message to nurse with change in recommendations to prescribing Lasix , according to note on 9/5 at 11:58 AM. However, no order was placed in chart and no prescription has sent to pharmacy for this medication.   Pt's daughter requested [facility] be [facility] [place] on [facility].   CB#  [phone]

## 2024-03-05 NOTE — Telephone Encounter (Signed)
**Note De-identified  [place] Obfuscation** Please advise 

## 2024-03-06 ENCOUNTER — Ambulatory Visit: Payer: Self-pay | Admitting: Internal Medicine

## 2024-03-08 MED ORDER — FUROSEMIDE 40 MG PO TABS: 40.0000 mg | ORAL_TABLET | Freq: Every day | ORAL | 0 refills | Status: DC

## 2024-03-08 NOTE — Telephone Encounter (Signed)
 Duplicate please see telephone encounter from Triage

## 2024-03-08 NOTE — Progress Notes (Signed)
 Spoke with pt's daughter, Lauraine, [place] per DPR and notified of results per Dr. Darlean. She verbalized understanding and denied any questions. She will call cards for rov to be scheduled. I refilled the lasix  x 1 only and advised to defer to cards in the future.

## 2024-03-08 NOTE — Progress Notes (Signed)
 Atc x1 lmtcb

## 2024-03-12 ENCOUNTER — Ambulatory Visit: Payer: Self-pay | Admitting: Internal Medicine

## 2024-03-12 NOTE — Progress Notes (Signed)
 Atc x1 lmtcb

## 2024-03-14 ENCOUNTER — Other Ambulatory Visit: Payer: Self-pay | Admitting: Cardiology

## 2024-03-15 NOTE — Progress Notes (Signed)
 Spoke with pt daughter Lauraine in dpr regarding pt results, she confirmed understanding. nfn

## 2024-03-15 NOTE — Telephone Encounter (Signed)
 CMA spoke with the patient & sent Lasix  to pharmacy in 9/6 encounter.

## 2024-03-30 ENCOUNTER — Other Ambulatory Visit: Payer: Self-pay | Admitting: Internal Medicine

## 2024-03-31 ENCOUNTER — Other Ambulatory Visit: Payer: Self-pay

## 2024-03-31 MED ORDER — FUROSEMIDE 40 MG PO TABS: 40.0000 mg | ORAL_TABLET | Freq: Every day | ORAL | 0 refills | Status: DC

## 2024-04-07 ENCOUNTER — Ambulatory Visit: Admitting: Cardiology

## 2024-04-20 ENCOUNTER — Encounter: Payer: Self-pay | Admitting: Cardiology

## 2024-06-29 ENCOUNTER — Other Ambulatory Visit: Payer: Self-pay | Admitting: Internal Medicine

## 2024-07-19 ENCOUNTER — Other Ambulatory Visit: Payer: Self-pay | Admitting: Physician Assistant

## 2024-07-19 ENCOUNTER — Encounter: Payer: Self-pay | Admitting: Cardiology

## 2024-07-19 MED ORDER — IRBESARTAN 300 MG PO TABS: 300.0000 mg | ORAL_TABLET | Freq: Every day | ORAL | 1 refills | Status: AC
